# Patient Record
Sex: Female | Born: 1955 | Race: Black or African American | Hispanic: No | Marital: Married | Smoking: Current every day smoker
Health system: Southern US, Community
[De-identification: ages and names within clinical notes are randomized; demographics above are authoritative.]

## PROBLEM LIST (undated history)

## (undated) DIAGNOSIS — E119 Type 2 diabetes mellitus without complications: Secondary | ICD-10-CM

## (undated) DIAGNOSIS — F259 Schizoaffective disorder, unspecified: Secondary | ICD-10-CM

## (undated) DIAGNOSIS — I10 Essential (primary) hypertension: Secondary | ICD-10-CM

## (undated) DIAGNOSIS — F250 Schizoaffective disorder, bipolar type: Secondary | ICD-10-CM

## (undated) HISTORY — PX: APPENDECTOMY: SHX54

## (undated) HISTORY — PX: OOPHORECTOMY: SHX86

## (undated) HISTORY — PX: HERNIA REPAIR: SHX51

---

## 2007-08-05 ENCOUNTER — Emergency Department (HOSPITAL_COMMUNITY): Admission: EM | Admit: 2007-08-05 | Discharge: 2007-08-05 | Payer: Self-pay | Admitting: Emergency Medicine

## 2007-11-15 ENCOUNTER — Emergency Department (HOSPITAL_COMMUNITY): Admission: EM | Admit: 2007-11-15 | Discharge: 2007-11-15 | Payer: Self-pay | Admitting: Emergency Medicine

## 2009-02-14 ENCOUNTER — Emergency Department (HOSPITAL_COMMUNITY): Admission: EM | Admit: 2009-02-14 | Discharge: 2009-02-14 | Payer: Self-pay | Admitting: Emergency Medicine

## 2010-01-13 ENCOUNTER — Emergency Department (HOSPITAL_COMMUNITY): Admission: EM | Admit: 2010-01-13 | Discharge: 2010-01-13 | Payer: Self-pay | Admitting: Emergency Medicine

## 2010-04-30 ENCOUNTER — Emergency Department (HOSPITAL_COMMUNITY): Payer: Medicaid Other

## 2010-04-30 ENCOUNTER — Inpatient Hospital Stay (HOSPITAL_COMMUNITY)
Admission: EM | Admit: 2010-04-30 | Discharge: 2010-05-03 | DRG: 419 | Disposition: A | Discharge status: Home / Self Care | Payer: Medicaid Other | Attending: General Surgery | Admitting: General Surgery

## 2010-04-30 DIAGNOSIS — F17200 Nicotine dependence, unspecified, uncomplicated: Secondary | ICD-10-CM | POA: Diagnosis present

## 2010-04-30 DIAGNOSIS — F209 Schizophrenia, unspecified: Secondary | ICD-10-CM | POA: Diagnosis present

## 2010-04-30 DIAGNOSIS — E669 Obesity, unspecified: Secondary | ICD-10-CM | POA: Diagnosis present

## 2010-04-30 DIAGNOSIS — F1410 Cocaine abuse, uncomplicated: Secondary | ICD-10-CM | POA: Diagnosis present

## 2010-04-30 DIAGNOSIS — K8010 Calculus of gallbladder with chronic cholecystitis without obstruction: Principal | ICD-10-CM | POA: Diagnosis present

## 2010-04-30 DIAGNOSIS — E876 Hypokalemia: Secondary | ICD-10-CM | POA: Diagnosis not present

## 2010-04-30 DIAGNOSIS — I10 Essential (primary) hypertension: Secondary | ICD-10-CM | POA: Diagnosis present

## 2010-04-30 LAB — DIFFERENTIAL
Basophils Absolute: 0 10*3/uL (ref 0.0–0.1)
Basophils Relative: 0 % (ref 0–1)
Eosinophils Relative: 2 % (ref 0–5)
Lymphocytes Relative: 27 % (ref 12–46)
Lymphs Abs: 1.6 10*3/uL (ref 0.7–4.0)
Monocytes Absolute: 0.6 10*3/uL (ref 0.1–1.0)
Monocytes Relative: 9 % (ref 3–12)
Neutro Abs: 3.9 10*3/uL (ref 1.7–7.7)
Neutrophils Relative %: 62 % (ref 43–77)

## 2010-04-30 LAB — COMPREHENSIVE METABOLIC PANEL
ALT: 50 U/L — ABNORMAL HIGH (ref 0–35)
AST: 67 U/L — ABNORMAL HIGH (ref 0–37)
Alkaline Phosphatase: 132 U/L — ABNORMAL HIGH (ref 39–117)
BUN: 9 mg/dL (ref 6–23)
CO2: 27 mEq/L (ref 19–32)
Calcium: 8.9 mg/dL (ref 8.4–10.5)
Chloride: 100 mEq/L (ref 96–112)
Creatinine, Ser: 0.7 mg/dL (ref 0.4–1.2)
GFR calc Af Amer: >60 mL/min (ref >60)
GFR calc non Af Amer: >60 mL/min (ref >60)
Glucose, Bld: 121 mg/dL — ABNORMAL HIGH (ref 70–99)
Potassium: 3.2 mEq/L — ABNORMAL LOW (ref 3.5–5.1)
Sodium: 136 mEq/L (ref 135–145)
Total Bilirubin: 1.4 mg/dL — ABNORMAL HIGH (ref 0.3–1.2)
Total Protein: 7.3 g/dL (ref 6.0–8.3)

## 2010-04-30 LAB — CBC
HCT: 40.5 % (ref 36.0–46.0)
Hemoglobin: 13.5 g/dL (ref 12.0–15.0)
MCH: 28.4 pg (ref 26.0–34.0)
MCHC: 33.3 g/dL (ref 30.0–36.0)
MCV: 85.1 fL (ref 78.0–100.0)
RDW: 13.7 % (ref 11.5–15.5)
WBC: 6.2 10*3/uL (ref 4.0–10.5)

## 2010-04-30 LAB — URINALYSIS, ROUTINE W REFLEX MICROSCOPIC
Bilirubin Urine: NEGATIVE
Hgb urine dipstick: NEGATIVE
Ketones, ur: NEGATIVE mg/dL
Protein, ur: NEGATIVE mg/dL
Specific Gravity, Urine: 1.025 (ref 1.005–1.030)
Urine Glucose, Fasting: NEGATIVE mg/dL
Urobilinogen, UA: 1 mg/dL (ref 0.0–1.0)
pH: 6.5 (ref 5.0–8.0)

## 2010-04-30 LAB — LIPASE, BLOOD: Lipase: 33 U/L (ref 11–59)

## 2010-04-30 LAB — RAPID URINE DRUG SCREEN, HOSP PERFORMED
Amphetamines: NOT DETECTED
Barbiturates: NOT DETECTED
Benzodiazepines: NOT DETECTED
Cocaine: POSITIVE — AB
Opiates: POSITIVE — AB
Tetrahydrocannabinol: NOT DETECTED

## 2010-05-02 ENCOUNTER — Other Ambulatory Visit: Payer: Self-pay | Admitting: General Surgery

## 2010-05-02 ENCOUNTER — Inpatient Hospital Stay (HOSPITAL_COMMUNITY): Payer: Medicaid Other

## 2010-05-02 LAB — BASIC METABOLIC PANEL
BUN: 6 mg/dL (ref 6–23)
CO2: 29 mEq/L (ref 19–32)
GFR calc non Af Amer: >60 mL/min (ref >60)
Glucose, Bld: 122 mg/dL — ABNORMAL HIGH (ref 70–99)
Potassium: 3.5 mEq/L (ref 3.5–5.1)
Sodium: 140 mEq/L (ref 135–145)

## 2010-05-02 LAB — HEPATIC FUNCTION PANEL
ALT: 56 U/L — ABNORMAL HIGH (ref 0–35)
AST: 72 U/L — ABNORMAL HIGH (ref 0–37)
Alkaline Phosphatase: 161 U/L — ABNORMAL HIGH (ref 39–117)
Bilirubin, Direct: 0.1 mg/dL (ref 0.0–0.3)
Total Bilirubin: 0.3 mg/dL (ref 0.3–1.2)
Total Protein: 6.5 g/dL (ref 6.0–8.3)

## 2010-05-03 LAB — CBC
HCT: 36.4 % (ref 36.0–46.0)
Hemoglobin: 11.8 g/dL — ABNORMAL LOW (ref 12.0–15.0)
MCH: 27.4 pg (ref 26.0–34.0)
MCHC: 32.4 g/dL (ref 30.0–36.0)
MCV: 84.5 fL (ref 78.0–100.0)
Platelets: 306 10*3/uL (ref 150–400)
RBC: 4.31 MIL/uL (ref 3.87–5.11)
WBC: 5.2 10*3/uL (ref 4.0–10.5)

## 2010-05-03 LAB — COMPREHENSIVE METABOLIC PANEL
ALT: 66 U/L — ABNORMAL HIGH (ref 0–35)
AST: 118 U/L — ABNORMAL HIGH (ref 0–37)
Albumin: 3.1 g/dL — ABNORMAL LOW (ref 3.5–5.2)
BUN: 2 mg/dL — ABNORMAL LOW (ref 6–23)
CO2: 28 mEq/L (ref 19–32)
Calcium: 9 mg/dL (ref 8.4–10.5)
Chloride: 104 mEq/L (ref 96–112)
Creatinine, Ser: 0.82 mg/dL (ref 0.4–1.2)
GFR calc Af Amer: >60 mL/min (ref >60)
GFR calc non Af Amer: >60 mL/min (ref >60)
Glucose, Bld: 117 mg/dL — ABNORMAL HIGH (ref 70–99)
Potassium: 3.7 mEq/L (ref 3.5–5.1)
Sodium: 139 mEq/L (ref 135–145)
Total Bilirubin: 0.8 mg/dL (ref 0.3–1.2)

## 2010-05-08 NOTE — H&P (Signed)
  [name], [name]                ACCOUNT NO.:  [id number]  MEDICAL RECORD NO.:  [id number]           PATIENT TYPE:  I  LOCATION:  5151                         FACILITY:  [facility]  PHYSICIAN:  Ollen Gross. Vernell Morgans, M.D. DATE OF BIRTH:  1955/09/28  DATE OF ADMISSION:  04/30/2010 DATE OF DISCHARGE:                             HISTORY & PHYSICAL   This is a 55 year old black female who presents to emergency department with right upper quadrant pain that started this morning.  She describes the pain as severe.  The pain is not associated with nausea or vomiting. She denies any fevers or chills.  No chest pain or shortness of breath. No diarrhea or dysuria.  The rest of review of systems is unremarkable.  PAST MEDICAL HISTORY:  Significant for: 1. Schizophrenia. 2. Hypertension. 3. Shingles.  PAST SURGICAL HISTORY:  Significant for: 1. Open appendectomy. 2. Ventral hernia repair. 3. Oophorectomy.  MEDICATIONS:  Abilify, hydrochlorothiazide, Risperdal, Cogentin.  ALLERGIES:  No known drug allergies.  SOCIAL HISTORY:  She smokes cigarettes and has done cocaine recently.  FAMILY HISTORY:  Noncontributory.  PHYSICAL EXAMINATION:  VITAL SIGNS:  Temperature is 98.5, pulse 66, blood pressure 137/94. GENERAL:  She is a well-developed, well-nourished black female, in no acute distress. SKIN:  Warm and dry.  No jaundice. HEENT:  Eyes:  Her extraocular movements are intact.  Pupils equal round and reactive to light.  Sclerae nonicteric. LUNGS:  Clear bilaterally with no use of accessory respiratory muscles. HEART:  Regular rate and rhythm with impulse in left chest. ABDOMEN:  Soft with some mild right upper quadrant tenderness but no palpable mass or hepatosplenomegaly. EXTREMITIES:  No cyanosis, clubbing, or edema with good strength in arms and legs. PSYCHOLOGIC:  She is alert and oriented x3 with no significant anxiety or depression.  On review of her lab work is significant for a  white count that was normal.  Total bili was just 1.4.  She had an ultrasound that showed a 2- cm gallstone in the neck of gallbladder but no gallbladder wall thickness or dilation of the common duct.  ASSESSMENT AND PLAN:  This is a 55 year old black female with what sounds like symptomatic gallstones.  We will plan to admit her.  We will obtain a urine drug screen.  If she is positive for this, then she may need cholecystectomy during this admission.     Ollen Gross. Vernell Morgans, M.D.     PST/MEDQ  D:  04/30/2010  T:  05/01/2010  Job:  [id number]  Electronically Signed by Chevis Pretty III M.D. on 05/06/2010 06:57:27 AM

## 2010-05-10 NOTE — Discharge Summary (Signed)
  NAMEGIULLIANA, [name]                ACCOUNT NO.:  [id number]  MEDICAL RECORD NO.:  [id number]           PATIENT TYPE:  I  LOCATION:  5151                         FACILITY:  [facility]  PHYSICIAN:  Ollen Gross. Vernell Morgans, M.D. DATE OF BIRTH:  Nov 12, 1955  DATE OF ADMISSION:  04/30/2010 DATE OF DISCHARGE:  05/03/2010                              DISCHARGE SUMMARY   ADMISSION DIAGNOSES: 1. Symptomatic cholecystitis. 2. Cocaine ingestion. 3. Schizophrenia. 4. Hypertension. 5. Shingles.  DISCHARGE DIAGNOSES: 1. Symptomatic cholecystitis. 2. Cocaine ingestion. 3. Schizophrenia. 4. Hypertension. 5. Shingles.  PROCEDURES:  Laparoscopic cholecystectomy.  BRIEF HISTORY:  The patient is a 55 year old African American female who presented to the ER with abdominal pain in the right upper quadrant. She was admitted after workup showed elevated LFTs and an ultrasound which showed a 2-cm gallstone in the neck of the gallbladder.  There were no block gallbladder wall thickness or dilatation of the common duct.  As a part of her workup, drug screen was obtained which showed she was positive for cocaine.  The patient also has a history of schizophrenia, hypertension, shingles.  She was placed on the floor on antibiotics and fluid.  She was admitted to the floor and  made n.p.o.   HOSPITAL COURSE: She was given time to come off her cocaine, and given clear liquids on May 01, 2010, because she could not understand why she was not being fed.  She was taken to the OR the following day, May 02, 2010, underwent a laparoscopic cholecystectomy.  She tolerated the procedure well, is back on the floor.  This morning, she is taking clear liquids without difficulty, wants to eat and wants to go home.  We will plan to advance her diet and mobilize her.  If she continues to do well, she will go home after lunch. D/C INSTRUCTIONS: DISCHARGE MEDICATIONS:  She will resume her Abilify 1 tablet daily, Cogentin  1 tablet b.i.d. 1 mg tablets, hydrochlorothiazide 25 mg daily, ibuprofen p.r.n. for pain, risperidone 1 tablet b.i.d.  New prescription will be Vicodin 5/325 one to two tablets q.4 h. p.r.n.  She returned to the [facility] for follow up on May 17, 2010, at 3:15.  She is to follow up with primary care as before.  She can clean her incisions with plain soap and water, shower, and remove Steri-Strips in about 1 week.  CONDITION ON DISCHARGE:  Improved.     Eber Hong, P.A.   ______________________________ Ollen Gross. Vernell Morgans, M.D.    WDJ/MEDQ  D:  05/03/2010  T:  05/03/2010  Job:  [id number]  Electronically Signed by Sherrie George P.A. on 05/09/2010 01:50:49 PM Electronically Signed by Chevis Pretty III M.D. on 05/10/2010 10:35:51 AM

## 2010-05-16 NOTE — Op Note (Signed)
NAMESHELDA, TRUBY                ACCOUNT NO.:  [id number]  MEDICAL RECORD NO.:  [id number]           PATIENT TYPE:  I  LOCATION:  5151                         FACILITY:  [facility]  PHYSICIAN:  Mary Sella. Andrey Campanile, MD     DATE OF BIRTH:  1955-04-03  DATE OF PROCEDURE:  05/02/2010 DATE OF DISCHARGE:                              OPERATIVE REPORT   PREOPERATIVE DIAGNOSIS:  Symptomatic cholelithiasis.  POSTOPERATIVE DIAGNOSIS:  Symptomatic cholelithiasis.  PROCEDURE:  Laparoscopic cholecystectomy with intraoperative cholangiogram.  SURGEON:  Mary Sella. Andrey Campanile, MD.  ASSISTANT SURGEON:  Well Marlyne Beards, [place].  ANESTHESIA:  General plus local, consisting of 0.25% Marcaine with epi.  FINDINGS:  A critical view was obtained.  There was a normal intraoperative cholangiogram.  There was prompt opacification of the common bile duct, hepatic duct, and left and right hepatic ducts.  There was prompt emptying of contrast into the duodenum.  There was no evidence of filling defect.  There was a little bit of bleeding from the gallbladder fossa.  Hemostasis was achieved with electrocautery.  I did place a piece of surgical snow into the gallbladder fossa.  SPECIMEN:  Gallbladder.  ESTIMATED BLOOD LOSS:  Minimal.  INDICATIONS FOR PROCEDURE:  The patient is a 55 year old African American female who has had epigastric pain and right upper quadrant pain.  The pain was so severe, prompting her to come to the emergency room.  She was found to have LFT elevation as well as multiple gallstones.  She was admitted for pain control.  Today, her bilirubin normalized; however, some of her transaminases were still elevated.  It did not appear that she had an obstructive picture.  I therefore recommended proceeding to the operating room for laparoscopic cholecystectomy with intraoperative cholangiogram, possible open.  We discussed the risks and benefits of surgery including bleeding, infection, injury to  surrounding structures, injury to the common bile duct requiring major reconstructive bile duct surgery, DVT occurrence, bile leak, prolonged diarrhea, need to convert to an open procedure, as well as enterotomies.  She elected to proceed with surgery.  DESCRIPTION OF PROCEDURE:  After obtaining informed consent, the patient was brought to the operating room, placed supine on the operating table. General endotracheal anesthesia was established.  Sequential compression devices had already been placed.  Her abdomen was prepped and draped in the usual standard surgical fashion.  She received antibiotics prior to skin incision.  A surgical time-out was performed.  Because she had a prior lower midline as well as periumbilical incision, I elected to gain entry into her abdomen in the left upper quadrant using the OptiVu technique.  A 1-cm incision was made two fingerbreadths below her left subcostal margin with a #11 blade.  Then, using the 0-degree 5-mm camera through a 5-mm trocar, I passed the trocar and the camera through all layers of the abdominal wall under direct visualization and carefully entered her abdominal cavity.  Pneumoperitoneum was smoothly established up to a patient pressure of 15 mmHg.  She had 1 or 2 adhesions of omentum to her lower midline.  She also had some bile and strings between her  right hepatic surface and the anterior abdominal surface.  I then placed three additional trocars, one in the subxiphoid and two in the right hypochondrium; all under direct visualization after local had been infiltrated.  I then placed the Hasson trocar by incising her skin right above the belly button, 1 inch in length; dividing the subcutaneous tissue.  The fascia was grasped and lifted anteriorly. Next, the fascia was incised with a #11 blade, and a pursestring suture was placed around the fascial edges using a 0 Vicryl on the UR6 needle. We then placed her in reverse Trendelenburg  and rotated her slightly to the left.  The gallbladder was grasped and went to the right shoulder. The infundibulum was grasped, retracted laterally.  I then incised the peritoneum both medially and laterally using hook electrocautery.  This helped me with mobilization.  I identified the cystic duct as well as the cystic artery, each were circumferentially dissected around with the aid of a Jola Babinski.  The critical view was obtained.  There were no other structures entering the gallbladder.  One clip was placed on the distal cystic duct as it entered the gallbladder, then it was partially transected with Endo shears.  The cholangiogram catheter was advanced and secured into the cystic duct with a clip.  The cholangiogram was performed as described above.  The clip securing the cholangiogram catheter was removed, and three clips were placed on the downside of the cystic duct.  Two clips were placed on the downside of cystic artery and one distal.  It was then transected with Endo shears along with the cystic duct.  There was a small posterior branch of the cystic artery, two clips were placed on the downside of that.  We then rolled the gallbladder up at the gallbladder fossa using traction and counter traction.  Hook electrocautery was used to free it.  There was a little bit of bleeding from the gallbladder fossa.  The gallbladder was essentially free.  I then placed the laparoscope in the subxiphoid trocar and advanced the Endobag up through the umbilical trocar.  The gallbladder was placed in the bag.  The gallbladder was quite distended and large.  I ended up incising the fascia superior slightly, about half a centimeter with a #11 blade, thus the specimen bag containing the gallbladder was easily removed.  We reestablished pneumoperitoneum.  I reinspected the gallbladder fossa.  There was still a little bit of oozing.  Electrocautery was used to obtain hemostasis.  I irrigated  the right upper quadrant with 1.5 L of saline.  There was no evidence of ongoing bleeding.  There was no evidence of bile leak.  I did, however, elect to place a piece of surgical snow in the gallbladder fossa.  I then removed the Hasson trocar, I tied down the previously placed pursestring suture.  Even though there was no air leak, there was still felt to be a little bit of a gap.  I placed two additional interrupted 0 Vicryl sutures to reapproximate the fascia even tighter.  The fascial closure was reinspected laparoscopically.  There was no evidence of any air leak, and there was nothing within our closure.  I reinspected the right upper quadrant and removed any remaining irrigation fluid.  There was no evidence of any ongoing bleeding.  Pneumoperitoneum was released, and all four remaining trocar incisions were removed.  All skin incisions were closed with a 4-0 Monocryl in a subcuticular fashion followed by application of Benzoin, Steri-Strips and sterile  bandages. There were no immediate complications.  The patient tolerated the procedure well.  She was extubated and taken to recovery room in stable addition.  All needle and instrument counts were correct x2.     Mary Sella. Andrey Campanile, MD     EMW/MEDQ  D:  05/02/2010  T:  05/03/2010  Job:  [id number]  Electronically Signed by Gaynelle Adu M.D. on 05/16/2010 08:28:33 AM

## 2010-09-09 ENCOUNTER — Emergency Department (HOSPITAL_COMMUNITY)
Admission: EM | Admit: 2010-09-09 | Discharge: 2010-09-09 | Disposition: A | Discharge status: Home / Self Care | Payer: Medicaid Other | Attending: Emergency Medicine | Admitting: Emergency Medicine

## 2010-09-09 ENCOUNTER — Emergency Department (HOSPITAL_COMMUNITY): Payer: Medicaid Other

## 2010-09-09 DIAGNOSIS — R5381 Other malaise: Secondary | ICD-10-CM | POA: Insufficient documentation

## 2010-09-09 DIAGNOSIS — J3489 Other specified disorders of nose and nasal sinuses: Secondary | ICD-10-CM | POA: Insufficient documentation

## 2010-09-09 DIAGNOSIS — I10 Essential (primary) hypertension: Secondary | ICD-10-CM | POA: Insufficient documentation

## 2010-09-09 DIAGNOSIS — R059 Cough, unspecified: Secondary | ICD-10-CM | POA: Insufficient documentation

## 2010-09-09 DIAGNOSIS — R6883 Chills (without fever): Secondary | ICD-10-CM | POA: Insufficient documentation

## 2010-09-09 DIAGNOSIS — J40 Bronchitis, not specified as acute or chronic: Secondary | ICD-10-CM | POA: Insufficient documentation

## 2010-09-09 DIAGNOSIS — R51 Headache: Secondary | ICD-10-CM | POA: Insufficient documentation

## 2010-09-09 DIAGNOSIS — J029 Acute pharyngitis, unspecified: Secondary | ICD-10-CM | POA: Insufficient documentation

## 2010-09-09 DIAGNOSIS — J069 Acute upper respiratory infection, unspecified: Secondary | ICD-10-CM | POA: Insufficient documentation

## 2010-09-09 DIAGNOSIS — R05 Cough: Secondary | ICD-10-CM | POA: Insufficient documentation

## 2011-01-21 ENCOUNTER — Emergency Department (HOSPITAL_COMMUNITY): Payer: Medicaid Other

## 2011-01-21 ENCOUNTER — Encounter (HOSPITAL_COMMUNITY): Payer: Self-pay | Admitting: Radiology

## 2011-01-21 ENCOUNTER — Emergency Department (HOSPITAL_COMMUNITY)
Admission: EM | Admit: 2011-01-21 | Discharge: 2011-01-21 | Disposition: A | Discharge status: Home / Self Care | Payer: Medicaid Other | Attending: Emergency Medicine | Admitting: Emergency Medicine

## 2011-01-21 DIAGNOSIS — R42 Dizziness and giddiness: Secondary | ICD-10-CM | POA: Insufficient documentation

## 2011-01-21 DIAGNOSIS — E876 Hypokalemia: Secondary | ICD-10-CM | POA: Insufficient documentation

## 2011-01-21 DIAGNOSIS — I10 Essential (primary) hypertension: Secondary | ICD-10-CM | POA: Insufficient documentation

## 2011-01-21 HISTORY — DX: Essential (primary) hypertension: I10

## 2011-01-21 LAB — POCT I-STAT, CHEM 8
BUN: 5 mg/dL — ABNORMAL LOW (ref 6–23)
Calcium, Ion: 1.16 mmol/L (ref 1.12–1.32)
Chloride: 99 mEq/L (ref 96–112)
Creatinine, Ser: 0.9 mg/dL (ref 0.50–1.10)
Glucose, Bld: 117 mg/dL — ABNORMAL HIGH (ref 70–99)
HCT: 43 % (ref 36.0–46.0)
Hemoglobin: 14.6 g/dL (ref 12.0–15.0)
Potassium: 2.9 mEq/L — ABNORMAL LOW (ref 3.5–5.1)
Sodium: 141 mEq/L (ref 135–145)
TCO2: 29 mmol/L (ref 0–100)

## 2011-02-23 ENCOUNTER — Encounter (HOSPITAL_COMMUNITY): Payer: Self-pay | Admitting: Emergency Medicine

## 2011-02-23 ENCOUNTER — Emergency Department (HOSPITAL_COMMUNITY)
Admission: EM | Admit: 2011-02-23 | Discharge: 2011-02-23 | Disposition: A | Discharge status: Home / Self Care | Payer: Medicaid Other | Attending: Emergency Medicine | Admitting: Emergency Medicine

## 2011-02-23 ENCOUNTER — Emergency Department (HOSPITAL_COMMUNITY): Payer: Medicaid Other

## 2011-02-23 DIAGNOSIS — I10 Essential (primary) hypertension: Secondary | ICD-10-CM | POA: Insufficient documentation

## 2011-02-23 DIAGNOSIS — R079 Chest pain, unspecified: Secondary | ICD-10-CM | POA: Insufficient documentation

## 2011-02-23 DIAGNOSIS — IMO0001 Reserved for inherently not codable concepts without codable children: Secondary | ICD-10-CM | POA: Insufficient documentation

## 2011-02-23 HISTORY — DX: Schizoaffective disorder, unspecified: F25.9

## 2011-02-23 HISTORY — DX: Schizoaffective disorder, bipolar type: F25.0

## 2011-02-23 LAB — DIFFERENTIAL
Basophils Absolute: 0 10*3/uL (ref 0.0–0.1)
Eosinophils Absolute: 0 10*3/uL (ref 0.0–0.7)
Eosinophils Relative: 0 % (ref 0–5)
Lymphocytes Relative: 11 % — ABNORMAL LOW (ref 12–46)
Lymphs Abs: 0.9 10*3/uL (ref 0.7–4.0)
Monocytes Absolute: 1.5 10*3/uL — ABNORMAL HIGH (ref 0.1–1.0)
Monocytes Relative: 18 % — ABNORMAL HIGH (ref 3–12)
Neutro Abs: 5.9 10*3/uL (ref 1.7–7.7)

## 2011-02-23 LAB — URINALYSIS, ROUTINE W REFLEX MICROSCOPIC
Bilirubin Urine: NEGATIVE
Glucose, UA: NEGATIVE mg/dL
Hgb urine dipstick: NEGATIVE
Ketones, ur: NEGATIVE mg/dL
Protein, ur: NEGATIVE mg/dL
Specific Gravity, Urine: 1.024 (ref 1.005–1.030)
pH: 5.5 (ref 5.0–8.0)

## 2011-02-23 LAB — CBC
HCT: 37 % (ref 36.0–46.0)
Hemoglobin: 12.3 g/dL (ref 12.0–15.0)
MCHC: 33.2 g/dL (ref 30.0–36.0)
MCV: 86.2 fL (ref 78.0–100.0)
Platelets: 223 10*3/uL (ref 150–400)
RDW: 13.9 % (ref 11.5–15.5)
WBC: 8.5 10*3/uL (ref 4.0–10.5)

## 2011-02-23 LAB — POCT I-STAT, CHEM 8
BUN: 12 mg/dL (ref 6–23)
Calcium, Ion: 1.06 mmol/L — ABNORMAL LOW (ref 1.12–1.32)
Chloride: 100 mEq/L (ref 96–112)
Creatinine, Ser: 1 mg/dL (ref 0.50–1.10)
Glucose, Bld: 147 mg/dL — ABNORMAL HIGH (ref 70–99)
HCT: 41 % (ref 36.0–46.0)
Hemoglobin: 13.9 g/dL (ref 12.0–15.0)
Potassium: 3.1 mEq/L — ABNORMAL LOW (ref 3.5–5.1)
Sodium: 136 mEq/L (ref 135–145)
TCO2: 24 mmol/L (ref 0–100)

## 2011-02-23 MED ORDER — ACETAMINOPHEN 325 MG PO TABS
ORAL_TABLET | ORAL | Status: AC
  Administered 2011-02-23: 650 mg
  Filled 2011-02-23: qty 2

## 2011-02-23 MED ORDER — IBUPROFEN 800 MG PO TABS: 800.0000 mg | ORAL_TABLET | Freq: Three times a day (TID) | ORAL | Status: DC

## 2011-02-23 MED ORDER — OSELTAMIVIR PHOSPHATE 75 MG PO CAPS: 75.0000 mg | ORAL_CAPSULE | Freq: Two times a day (BID) | ORAL | Status: DC

## 2011-02-23 MED ORDER — SODIUM CHLORIDE 0.9 % IV BOLUS (SEPSIS)
1000.0000 mL | Freq: Once | INTRAVENOUS | Status: AC
  Administered 2011-02-23: 1000 mL via INTRAVENOUS

## 2011-02-23 MED ORDER — KETOROLAC TROMETHAMINE 30 MG/ML IJ SOLN
30.0000 mg | Freq: Once | INTRAMUSCULAR | Status: AC
  Administered 2011-02-23: 30 mg via INTRAVENOUS
  Filled 2011-02-23: qty 1

## 2011-02-23 NOTE — ED Notes (Signed)
ZOX:[id number]<AV> Expected date:02/23/11<BR> Expected time: 9:31 AM<BR> Means of arrival:Ambulance<BR> Comments:<BR> Flu like s/s

## 2011-02-23 NOTE — ED Provider Notes (Signed)
Medical screening examination/treatment/procedure(s) were performed by non-physician practitioner and as supervising physician I was immediately available for consultation/collaboration.  Flint Melter, MD 02/23/11 2033

## 2011-02-23 NOTE — ED Provider Notes (Signed)
History     CSN: [id number] Arrival date & time: 02/23/2011 10:00 AM   First MD Initiated Contact with Patient 02/23/11 1059     11:39 AM HPI Patient reports flulike symptoms that began yesterday. States more symptoms include myalgias and chills. Reports family members with similar symptoms. Reports taking her flu shot and feels like she has the flu. Denies shortness of breath, neck pain, abdominal pain. Has not tried any medications at home to relieve symptoms. Patient is a 55 y.o. female presenting with flu symptoms. The history is provided by the patient.  Influenza This is a new problem. The current episode started yesterday. The problem occurs constantly. The problem has been gradually worsening. Associated symptoms include chest pain, chills, congestion, coughing, fatigue, a fever, headaches, myalgias and neck pain. Pertinent negatives include no abdominal pain, nausea, rash, sore throat, swollen glands, urinary symptoms, vomiting or weakness.    Past Medical History  Diagnosis Date  . Hypertension   . Schizo-affective schizophrenia     No past surgical history on file.  No family history on file.  History  Substance Use Topics  . Smoking status: Former Games developer  . Smokeless tobacco: Not on file  . Alcohol Use: No    OB History    Grav Para Term Preterm Abortions TAB SAB Ect Mult Living                  Review of Systems  Constitutional: Positive for fever, chills and fatigue.  HENT: Positive for congestion and neck pain. Negative for sore throat.   Respiratory: Positive for cough.   Cardiovascular: Positive for chest pain.  Gastrointestinal: Negative for nausea, vomiting and abdominal pain.  Musculoskeletal: Positive for myalgias.  Skin: Negative for rash.  Neurological: Positive for headaches. Negative for weakness.  All other systems reviewed and are negative.    Allergies  Review of patient's allergies indicates no known allergies.  Home Medications  No  current outpatient prescriptions on file.  BP 103/58  Pulse 93  Temp(Src) 103.1 F (39.5 C) (Oral)  Resp 20  Ht 5\' 4"  (1.626 m)  Wt 240 lb (108.863 kg)  BMI 41.20 kg/m2  SpO2 95%  Physical Exam  Vitals reviewed. Constitutional: She is oriented to person, place, and time. Vital signs are normal. She appears well-developed and well-nourished.  HENT:  Head: Normocephalic and atraumatic.  Right Ear: Tympanic membrane, external ear and ear canal normal.  Left Ear: Tympanic membrane, external ear and ear canal normal.  Nose: Nose normal.  Mouth/Throat: Uvula is midline, oropharynx is clear and moist and mucous membranes are normal.  Eyes: Conjunctivae are normal. Pupils are equal, round, and reactive to light.  Neck: Trachea normal and normal range of motion. Neck supple. No spinous process tenderness and no muscular tenderness present. No rigidity. No mass and no thyromegaly present.  Cardiovascular: Normal rate, regular rhythm and normal heart sounds.  Exam reveals no friction rub.   No murmur heard. Pulmonary/Chest: Effort normal and breath sounds normal. She has no wheezes. She has no rhonchi. She has no rales. She exhibits no tenderness.  Musculoskeletal: Normal range of motion.  Neurological: She is alert and oriented to person, place, and time. Coordination normal.  Skin: Skin is warm and dry. No rash noted. No erythema. No pallor.    ED Course  Procedures   Will Treat with tamiflu and ibuprofen and acetaminophen.  MDM         Thomasene Lot, PA 02/23/11 1235

## 2011-02-23 NOTE — ED Notes (Addendum)
Pt c/o body aches and pain with fever x2 days

## 2011-03-03 ENCOUNTER — Encounter (HOSPITAL_COMMUNITY): Payer: Self-pay | Admitting: Physical Medicine and Rehabilitation

## 2011-03-03 ENCOUNTER — Emergency Department (HOSPITAL_COMMUNITY)
Admission: EM | Admit: 2011-03-03 | Discharge: 2011-03-03 | Disposition: A | Discharge status: Home / Self Care | Payer: Medicaid Other | Attending: Emergency Medicine | Admitting: Emergency Medicine

## 2011-03-03 DIAGNOSIS — I10 Essential (primary) hypertension: Secondary | ICD-10-CM | POA: Insufficient documentation

## 2011-03-03 DIAGNOSIS — F259 Schizoaffective disorder, unspecified: Secondary | ICD-10-CM | POA: Insufficient documentation

## 2011-03-03 DIAGNOSIS — H6120 Impacted cerumen, unspecified ear: Secondary | ICD-10-CM | POA: Insufficient documentation

## 2011-03-03 DIAGNOSIS — H8110 Benign paroxysmal vertigo, unspecified ear: Secondary | ICD-10-CM

## 2011-03-03 MED ORDER — LORAZEPAM 1 MG PO TABS: 1.0000 mg | ORAL_TABLET | Freq: Three times a day (TID) | ORAL | Status: AC | PRN

## 2011-03-03 MED ORDER — LORAZEPAM 1 MG PO TABS
1.0000 mg | ORAL_TABLET | Freq: Once | ORAL | Status: AC
  Administered 2011-03-03: 1 mg via ORAL
  Filled 2011-03-03: qty 1

## 2011-03-03 NOTE — ED Provider Notes (Signed)
History     CSN: [id number] Arrival date & time: 03/03/2011  5:42 PM   First MD Initiated Contact with Patient 03/03/11 2044      Chief Complaint  Patient presents with  . Dizziness    HPI  History provided by the patient. Patient presents with complaints of dizziness. Symptoms are described as "my head spinning". Symptoms last only brief seconds. They're made worse by certain movements and positions especially with lying back and having had turned to the right. Gums are better patient states that she turns her head to the left and remains still. Patient does report having some symptoms of a URI last month. Currently she denies any fever, chills, sweats, headache, nausea, vomiting, cough, nasal congestion. Patient does report having an appointment with her PCP next Wednesday for these complaints. She reports that she had similar episodes once before that only lasted a day or so and went away. she denies any confusion, weakness in extremities, numbness or tingling, difficulty speaking or vision changes.   Past Medical History  Diagnosis Date  . Hypertension   . Schizo-affective schizophrenia     History reviewed. No pertinent past surgical history.  History reviewed. No pertinent family history.  History  Substance Use Topics  . Smoking status: Former Games developer  . Smokeless tobacco: Not on file  . Alcohol Use: No    OB History    Grav Para Term Preterm Abortions TAB SAB Ect Mult Living                  Review of Systems  Constitutional: Negative for fever and chills.  HENT: Negative for ear pain, congestion, sore throat and rhinorrhea.   Respiratory: Negative for cough.   Cardiovascular: Negative for chest pain.  Gastrointestinal: Negative for nausea, vomiting, diarrhea and constipation.  Neurological: Positive for dizziness.  All other systems reviewed and are negative.    Allergies  Review of patient's allergies indicates no known allergies.  Home Medications  No  current outpatient prescriptions on file.  BP 101/67  Pulse 66  Temp(Src) 98.3 F (36.8 C) (Oral)  Resp 16  SpO2 100%  Physical Exam  Nursing note and vitals reviewed. Constitutional: She appears well-developed. No distress.  HENT:  Head: Normocephalic and atraumatic.  Right Ear: External ear normal.  Mouth/Throat: Oropharynx is clear and moist.       Left cerumen impaction  Eyes: Conjunctivae and EOM are normal. Pupils are equal, round, and reactive to light.  Neck: Normal range of motion. Neck supple.  Cardiovascular: Normal rate, regular rhythm and normal heart sounds.   Pulmonary/Chest: Effort normal and breath sounds normal.  Musculoskeletal: She exhibits no edema and no tenderness.  Lymphadenopathy:    She has no cervical adenopathy.  Neurological: She is alert. She has normal strength. No cranial nerve deficit or sensory deficit. Coordination and gait normal.       Vision with reproducible symptoms during Dix-Hallpike maneuver with head turned to the right. No notable nystagmus. She has nonfocal neuro exam  Skin: Skin is warm. No rash noted.  Psychiatric: She has a normal mood and affect. Her behavior is normal.    ED Course  Procedures (including critical care time)   1. BPPV (benign paroxysmal positional vertigo)      MDM  9:00 PM patient seen and evaluated. Patient in no acute distress. Pt's symptoms consistent with peripheral vertigo. She has normal nonfocal neuro exam.   Patient having improvement of symptoms. Patient was discussed with attending  physician, he agrees with diagnosis and treatment plan.     Angus Seller, [place] 03/03/11 2133

## 2011-03-03 NOTE — ED Notes (Signed)
Pt states, "I have never felt like this before, I just feel like something is wrong. I don't want to fall asleep I am afraid I won't wake up. My head just is spinning so much. About 2 mths ago this happened & my Potassium was low & I came to [facility] ED & was given Potassium & now it has come back x 2days."

## 2011-03-03 NOTE — ED Notes (Signed)
Updated on delay. No distress noted.  

## 2011-03-03 NOTE — ED Notes (Signed)
Pt presents to department for evaluation of dizziness. Pt states she feels like she is going to pass out, becomes very dizzy with standing. Ongoing for several days. Pt states she had this before, but it went away without medical attention. Pt is conscious alert and oriented x4. No neurological deficits noted. Pupils equal and reactive.

## 2011-03-04 NOTE — ED Provider Notes (Signed)
Medical screening examination/treatment/procedure(s) were performed by non-physician practitioner and as supervising physician I was immediately available for consultation/collaboration.  Zian Mohamed P Glendia Olshefski, MD 03/04/11 1507 

## 2012-03-16 ENCOUNTER — Emergency Department (HOSPITAL_COMMUNITY)
Admission: EM | Admit: 2012-03-16 | Discharge: 2012-03-16 | Disposition: A | Discharge status: Home / Self Care | Payer: Medicaid Other | Attending: Emergency Medicine | Admitting: Emergency Medicine

## 2012-03-16 ENCOUNTER — Encounter (HOSPITAL_COMMUNITY): Payer: Self-pay | Admitting: *Deleted

## 2012-03-16 ENCOUNTER — Emergency Department (HOSPITAL_COMMUNITY): Payer: Medicaid Other

## 2012-03-16 DIAGNOSIS — I10 Essential (primary) hypertension: Secondary | ICD-10-CM | POA: Insufficient documentation

## 2012-03-16 DIAGNOSIS — M25559 Pain in unspecified hip: Secondary | ICD-10-CM | POA: Insufficient documentation

## 2012-03-16 DIAGNOSIS — Z87891 Personal history of nicotine dependence: Secondary | ICD-10-CM | POA: Insufficient documentation

## 2012-03-16 DIAGNOSIS — M1990 Unspecified osteoarthritis, unspecified site: Secondary | ICD-10-CM

## 2012-03-16 DIAGNOSIS — Z79899 Other long term (current) drug therapy: Secondary | ICD-10-CM | POA: Insufficient documentation

## 2012-03-16 DIAGNOSIS — G8929 Other chronic pain: Secondary | ICD-10-CM | POA: Insufficient documentation

## 2012-03-16 DIAGNOSIS — F259 Schizoaffective disorder, unspecified: Secondary | ICD-10-CM | POA: Insufficient documentation

## 2012-03-16 DIAGNOSIS — M1610 Unilateral primary osteoarthritis, unspecified hip: Secondary | ICD-10-CM | POA: Insufficient documentation

## 2012-03-16 MED ORDER — NAPROXEN 250 MG PO TABS
500.0000 mg | ORAL_TABLET | Freq: Two times a day (BID) | ORAL | Status: DC
  Administered 2012-03-16: 500 mg via ORAL
  Filled 2012-03-16: qty 2

## 2012-03-16 MED ORDER — NAPROXEN 500 MG PO TABS: 500.0000 mg | ORAL_TABLET | Freq: Two times a day (BID) | ORAL | Status: DC

## 2012-03-16 NOTE — ED Notes (Signed)
Pt reports left hip pain x 2 days. Described as sharp pain radiating up towards abdomen and down to thigh. States "it gave out on me, but I didn't fall. I had to call someone to come help me." Pain 10/10 with movement and laying on the left side. 0/10 when still. Speech rapid and pressured. Reports hxt of schzo effective disorder.

## 2012-03-16 NOTE — ED Notes (Signed)
Pt states understanding of discharge instructions 

## 2012-03-16 NOTE — ED Provider Notes (Signed)
History     CSN: [id number]  Arrival date & time 03/16/12  1844   First MD Initiated Contact with Patient 03/16/12 1957      Chief Complaint  Patient presents with  . Hip Pain    (Consider location/radiation/quality/duration/timing/severity/associated sxs/prior treatment) HPI Comments: This is a 56 year old female, who presents emergency department with chief complaint of left sided hip pain. Patient states that the pain is mostly in her left buttock and radiates down the back of her leg. She states that this pain has chronic, though it seems to be worsened over the past couple of days. Patient has tried taking advil with significant relief. Patient wants an x-ray, because she is concerned about cooking Christmas dinner. She is able to ambulate appropriately. Patient states that her pain is moderate in severity.  The history is provided by the patient.    Past Medical History  Diagnosis Date  . Hypertension   . Schizo-affective schizophrenia     History reviewed. No pertinent past surgical history.  History reviewed. No pertinent family history.  History  Substance Use Topics  . Smoking status: Former Games developer  . Smokeless tobacco: Not on file  . Alcohol Use: No    OB History    Grav Para Term Preterm Abortions TAB SAB Ect Mult Living                  Review of Systems  All other systems reviewed and are negative.    Allergies  Review of patient's allergies indicates no known allergies.  Home Medications   Current Outpatient Rx  Name  Route  Sig  Dispense  Refill  . ARIPIPRAZOLE 30 MG PO TABS   Oral   Take 30 mg by mouth daily.           BP 130/95  Pulse 69  Temp 99.5 F (37.5 C) (Oral)  Resp 16  SpO2 97%  Physical Exam  Nursing note and vitals reviewed. Constitutional: She is oriented to person, place, and time. She appears well-developed and well-nourished.  HENT:  Head: Normocephalic and atraumatic.  Eyes: Conjunctivae normal and EOM are  normal. Pupils are equal, round, and reactive to light.  Neck: Normal range of motion. Neck supple.  Cardiovascular: Normal rate and regular rhythm.  Exam reveals no gallop and no friction rub.   No murmur heard. Pulmonary/Chest: Effort normal and breath sounds normal. No respiratory distress. She has no wheezes. She has no rales. She exhibits no tenderness.  Abdominal: Soft. Bowel sounds are normal. She exhibits no distension and no mass. There is no tenderness. There is no rebound and no guarding.  Musculoskeletal: Normal range of motion. She exhibits no edema and no tenderness.       Full range of motion in left hip, however the patient states that it hurts to move, strength is 5 out of 5. No signs of abscess, cellulitis, no warmth of the joints, no bruising or swelling.  Neurological: She is alert and oriented to person, place, and time.  Skin: Skin is warm and dry.  Psychiatric: She has a normal mood and affect. Her behavior is normal. Judgment and thought content normal.    ED Course  Procedures (including critical care time)  Labs Reviewed - No data to display Dg Hip Complete Left  03/16/2012  *RADIOLOGY REPORT*  Clinical Data: Fall.  Left hip pain.  LEFT HIP - COMPLETE 2+ VIEW  Comparison: None.  Findings: The pelvic bony ring is intact.  Left  hip is located without acute fracture.  The sacroiliac joints are symmetric.  IMPRESSION: No acute bony abnormality to the pelvis or left hip.   Original Report Authenticated By: Richarda Overlie, M.D.      1. Hip pain   2. Arthritis       MDM  56 year old female with left-sided hip pain. I believe this to be arthritis. The patient is able to ambulate, and asks if she can go home after receiving the x-ray. Plain films show no acute process. I find no evidence of swelling, or abscess, or bony tenderness on my exam. I will treat the patient with Naprosyn, and have her followup with primary care provider or orthopedics. Patient understands and  agrees with the plan. Return precautions have been given. She is stable and ready for discharge.        Roxy Horseman, PA-C 03/17/12 [phone]

## 2012-03-16 NOTE — ED Notes (Signed)
Lt. Hip, let. Leg pain

## 2012-03-17 NOTE — ED Provider Notes (Signed)
Medical screening examination/treatment/procedure(s) were performed by non-physician practitioner and as supervising physician I was immediately available for consultation/collaboration.   Carleene Cooper III, MD 03/17/12 [phone]

## 2012-03-20 ENCOUNTER — Emergency Department (HOSPITAL_COMMUNITY)
Admission: EM | Admit: 2012-03-20 | Discharge: 2012-03-20 | Disposition: A | Discharge status: Home / Self Care | Payer: Medicaid Other | Attending: Emergency Medicine | Admitting: Emergency Medicine

## 2012-03-20 ENCOUNTER — Encounter (HOSPITAL_COMMUNITY): Payer: Self-pay | Admitting: Emergency Medicine

## 2012-03-20 DIAGNOSIS — Z791 Long term (current) use of non-steroidal anti-inflammatories (NSAID): Secondary | ICD-10-CM | POA: Insufficient documentation

## 2012-03-20 DIAGNOSIS — M25559 Pain in unspecified hip: Secondary | ICD-10-CM

## 2012-03-20 DIAGNOSIS — F17200 Nicotine dependence, unspecified, uncomplicated: Secondary | ICD-10-CM | POA: Insufficient documentation

## 2012-03-20 DIAGNOSIS — F259 Schizoaffective disorder, unspecified: Secondary | ICD-10-CM | POA: Insufficient documentation

## 2012-03-20 DIAGNOSIS — Z79899 Other long term (current) drug therapy: Secondary | ICD-10-CM | POA: Insufficient documentation

## 2012-03-20 DIAGNOSIS — I10 Essential (primary) hypertension: Secondary | ICD-10-CM | POA: Insufficient documentation

## 2012-03-20 MED ORDER — KETOROLAC TROMETHAMINE 60 MG/2ML IM SOLN
60.0000 mg | Freq: Once | INTRAMUSCULAR | Status: AC
  Administered 2012-03-20: 60 mg via INTRAMUSCULAR
  Filled 2012-03-20: qty 2

## 2012-03-20 MED ORDER — CYCLOBENZAPRINE HCL 10 MG PO TABS: 10.0000 mg | ORAL_TABLET | Freq: Two times a day (BID) | ORAL | Status: DC | PRN

## 2012-03-20 NOTE — ED Provider Notes (Signed)
Medical screening examination/treatment/procedure(s) were performed by non-physician practitioner and as supervising physician I was immediately available for consultation/collaboration.  Raeford Razor, MD 03/20/12 [phone]

## 2012-03-20 NOTE — ED Provider Notes (Signed)
History     CSN: [id number]  Arrival date & time 03/20/12  1145   First MD Initiated Contact with Patient 03/20/12 1156      Chief Complaint  Patient presents with  . Hip Pain    l/hip pain, recurrent    (Consider location/radiation/quality/duration/timing/severity/associated sxs/prior treatment) HPI Comments: 56 year old female with a history of schizophrenia presents to the emergency department with her son complaining of continuing left hip pain after being seen on December 22. She was discharged at that time with Naprosyn and states this provides no relief. Describes the pain as throbbing rated 10 out of 10, worse with walking. Denies numbness or tingling down her leg, states "it is just my hip, leave me alone, I am in pain". No back pain. No known injury or trauma. She had plain films at her last visit which had evidence of arthritis and no other abnormality.  Patient is a 56 y.o. female presenting with hip pain. The history is provided by the patient and a relative.  Hip Pain Associated symptoms include arthralgias. Pertinent negatives include no numbness.    Past Medical History  Diagnosis Date  . Hypertension   . Schizo-affective schizophrenia     Past Surgical History  Procedure Date  . Appendectomy   . Hernia repair   . Oophorectomy unilateral    Family History  Problem Relation Age of Onset  . Hypertension Mother   . Diabetes Mother   . Heart failure Mother     History  Substance Use Topics  . Smoking status: Current Every Day Smoker    Types: Cigarettes  . Smokeless tobacco: Not on file  . Alcohol Use: No    OB History    Grav Para Term Preterm Abortions TAB SAB Ect Mult Living                  Review of Systems  Constitutional: Negative.   Musculoskeletal: Positive for arthralgias and gait problem. Negative for back pain.  Skin: Negative.   Neurological: Negative for numbness.    Allergies  Review of patient's allergies indicates no known  allergies.  Home Medications   Current Outpatient Rx  Name  Route  Sig  Dispense  Refill  . ARIPIPRAZOLE 30 MG PO TABS   Oral   Take 30 mg by mouth daily.         . IBUPROFEN 200 MG PO TABS   Oral   Take 800 mg by mouth every 6 (six) hours as needed. For pain         . NAPROXEN 500 MG PO TABS   Oral   Take 1 tablet (500 mg total) by mouth 2 (two) times daily with a meal.   30 tablet   0   . CYCLOBENZAPRINE HCL 10 MG PO TABS   Oral   Take 1 tablet (10 mg total) by mouth 2 (two) times daily as needed for muscle spasms.   10 tablet   0     BP 127/86  Pulse 70  Temp 98.4 F (36.9 C) (Oral)  Resp 18  Physical Exam  Nursing note and vitals reviewed. Constitutional: She is oriented to person, place, and time. She appears well-developed.       Obese  HENT:  Head: Normocephalic and atraumatic.  Eyes: Conjunctivae normal are normal.  Neck: Normal range of motion.  Cardiovascular: Normal rate, regular rhythm, normal heart sounds and intact distal pulses.   Pulmonary/Chest: Effort normal and breath sounds normal.  Musculoskeletal:       Left hip: She exhibits tenderness (lateral aspect). She exhibits normal range of motion, normal strength, no bony tenderness, no swelling and no crepitus.  Neurological: She is alert and oriented to person, place, and time. She has normal strength. No sensory deficit. Gait (limping) abnormal.  Skin: Skin is warm and dry.  Psychiatric: Her affect is blunt. She is agitated.    ED Course  Procedures (including critical care time)  Labs Reviewed - No data to display No results found.   1. Hip pain       MDM  56 y/o female with continuing hip pain after being seen on 12/22. Patient is uncooperative on PE stating it is "just her hip, leave her alone". Eventually son got her to cooperate. No edema noted around hip. She is able to ambulate. Full ROM. Good strength. Toradol given. Discharged with flexeril. She has an appointment with  ortho next week as advised at her last visit. Return precautions discussed.        Trevor Mace, PA-C 03/20/12 1245

## 2012-03-20 NOTE — ED Notes (Signed)
Pain l/hip. Difficulty walking due to pain. Pt reports that there was no relief of pain with naproxen, pt seen 12/21

## 2012-06-05 ENCOUNTER — Emergency Department (HOSPITAL_COMMUNITY)
Admission: EM | Admit: 2012-06-05 | Discharge: 2012-06-05 | Disposition: A | Discharge status: Home / Self Care | Payer: Medicaid Other | Attending: Emergency Medicine | Admitting: Emergency Medicine

## 2012-06-05 ENCOUNTER — Encounter (HOSPITAL_COMMUNITY): Payer: Self-pay | Admitting: Emergency Medicine

## 2012-06-05 DIAGNOSIS — R059 Cough, unspecified: Secondary | ICD-10-CM | POA: Insufficient documentation

## 2012-06-05 DIAGNOSIS — R05 Cough: Secondary | ICD-10-CM | POA: Insufficient documentation

## 2012-06-05 DIAGNOSIS — I10 Essential (primary) hypertension: Secondary | ICD-10-CM | POA: Insufficient documentation

## 2012-06-05 DIAGNOSIS — R51 Headache: Secondary | ICD-10-CM | POA: Insufficient documentation

## 2012-06-05 DIAGNOSIS — J3489 Other specified disorders of nose and nasal sinuses: Secondary | ICD-10-CM | POA: Insufficient documentation

## 2012-06-05 DIAGNOSIS — R509 Fever, unspecified: Secondary | ICD-10-CM

## 2012-06-05 DIAGNOSIS — Z79899 Other long term (current) drug therapy: Secondary | ICD-10-CM | POA: Insufficient documentation

## 2012-06-05 DIAGNOSIS — F17200 Nicotine dependence, unspecified, uncomplicated: Secondary | ICD-10-CM | POA: Insufficient documentation

## 2012-06-05 DIAGNOSIS — M1610 Unilateral primary osteoarthritis, unspecified hip: Secondary | ICD-10-CM | POA: Insufficient documentation

## 2012-06-05 DIAGNOSIS — J029 Acute pharyngitis, unspecified: Secondary | ICD-10-CM | POA: Insufficient documentation

## 2012-06-05 DIAGNOSIS — M25559 Pain in unspecified hip: Secondary | ICD-10-CM | POA: Insufficient documentation

## 2012-06-05 DIAGNOSIS — F259 Schizoaffective disorder, unspecified: Secondary | ICD-10-CM | POA: Insufficient documentation

## 2012-06-05 MED ORDER — SODIUM CHLORIDE 0.9 % IV BOLUS (SEPSIS)
1000.0000 mL | Freq: Once | INTRAVENOUS | Status: AC
  Administered 2012-06-05: 1000 mL via INTRAVENOUS

## 2012-06-05 MED ORDER — ACETAMINOPHEN 325 MG PO TABS
650.0000 mg | ORAL_TABLET | Freq: Once | ORAL | Status: AC
  Administered 2012-06-05: 650 mg via ORAL
  Filled 2012-06-05: qty 2

## 2012-06-05 NOTE — Progress Notes (Signed)
Pt confirms pcp is [facility] updated

## 2012-06-05 NOTE — Progress Notes (Signed)
CSW met with pt at bedside to assess transportation needs. Pt walks with a cane and [place] to far away from bus stop. Pt daughters unable to provide transportation due to working until midnight. Pt provided with cab voucher.   Catha Gosselin, LCSWA  [phone] .06/05/2012 1747pm

## 2012-06-05 NOTE — ED Notes (Signed)
Per EMS: Pt c/o weakness, cough, fever x 24 hrs.

## 2012-06-05 NOTE — ED Provider Notes (Signed)
History     CSN: [id number]  Arrival date & time 06/05/12  1429   First MD Initiated Contact with Patient 06/05/12 1513      Chief Complaint  Patient presents with  . Flu-Like Sx     (Consider location/radiation/quality/duration/timing/severity/associated sxs/prior treatment) HPI Comments: 57 y.o. Female presenting with fever, chills, sore throat, and productive cough since Sunday. BIBMES with saline lock in place.   Pain includes sore throat (severe, hurts to swallow, constant, nothing makes it better or worse, no interventions), right hip (chronic, constant, nothing makes it better or worse, no interventions), and gradual onset frontal headache (constant, throbbing, nothing makes it better or worse, no interventions)  Denies hemoptysis, chest pain, pleuritic chest pain, shortness of brath, nausea, vomiting, diarrhea, constipation, burning on urination, abdominal, pain, sick contacts.   Pt has taken no interventions. Nothing makes it better or worse.   PMHx includes HTN and schizo-affective schizophrenia.   Past Medical History  Diagnosis Date  . Hypertension   . Schizo-affective schizophrenia     Past Surgical History  Procedure Laterality Date  . Appendectomy    . Hernia repair    . Oophorectomy  unilateral    Family History  Problem Relation Age of Onset  . Hypertension Mother   . Diabetes Mother   . Heart failure Mother     History  Substance Use Topics  . Smoking status: Current Every Day Smoker    Types: Cigarettes  . Smokeless tobacco: Not on file  . Alcohol Use: No    OB History   Grav Para Term Preterm Abortions TAB SAB Ect Mult Living                  Review of Systems  Constitutional: Positive for fever. Negative for diaphoresis.  HENT: Positive for rhinorrhea. Negative for ear pain, neck pain, neck stiffness and sinus pressure.   Eyes: Negative for visual disturbance.  Respiratory: Negative for apnea, cough, chest tightness and shortness of  breath.        Chest wall pain  Cardiovascular: Negative for chest pain and palpitations.  Gastrointestinal: Negative for nausea, vomiting, abdominal pain, diarrhea and constipation.  Genitourinary: Negative for dysuria and pelvic pain.  Musculoskeletal: Positive for gait problem.       Pt walks with a cane. Right hip pain due to chronic arthritis.  Skin: Negative for rash.  Neurological: Positive for headaches. Negative for dizziness, weakness, light-headedness and numbness.       Frontal headache.    Allergies  Review of patient's allergies indicates no known allergies.  Home Medications   Current Outpatient Rx  Name  Route  Sig  Dispense  Refill  . ARIPiprazole (ABILIFY) 30 MG tablet   Oral   Take 30 mg by mouth daily.         . cyclobenzaprine (FLEXERIL) 10 MG tablet   Oral   Take 1 tablet (10 mg total) by mouth 2 (two) times daily as needed for muscle spasms.   10 tablet   0   . ibuprofen (ADVIL,MOTRIN) 200 MG tablet   Oral   Take 800 mg by mouth every 6 (six) hours as needed. For pain         . naproxen (NAPROSYN) 500 MG tablet   Oral   Take 1 tablet (500 mg total) by mouth 2 (two) times daily with a meal.   30 tablet   0     BP 111/68  Pulse 92  Temp(Src) 104.8  F (40.4 C) (Oral)  Resp 18  SpO2 100%  Physical Exam  Nursing note and vitals reviewed. Constitutional: She is oriented to person, place, and time. She appears well-developed and well-nourished. No distress.  HENT:  Head: Normocephalic and atraumatic.  TMs normal. Throat erythematous. No tonsillar exudate. Mild cervical lympadenopathy.  Eyes: Conjunctivae and EOM are normal.  Neck: Normal range of motion. Neck supple.  No meningeal signs  Cardiovascular: Normal rate, regular rhythm, normal heart sounds and intact distal pulses.  Exam reveals no gallop and no friction rub.   No murmur heard. Pulmonary/Chest: Effort normal and breath sounds normal. No respiratory distress. She has no  wheezes. She has no rales. She exhibits no tenderness.  Abdominal: Soft. Bowel sounds are normal. She exhibits no distension. There is no tenderness. There is no rebound and no guarding.  Musculoskeletal: Normal range of motion. She exhibits no edema and no tenderness.  Neurological: She is alert and oriented to person, place, and time. No cranial nerve deficit.  Skin: Skin is warm and dry. She is not diaphoretic. No erythema.    ED Course  Procedures (including critical care time)  Labs Reviewed  RAPID STREP SCREEN   Results for orders placed during the hospital encounter of 06/05/12  RAPID STREP SCREEN      Result Value Range   Streptococcus, Group A Screen (Direct) NEGATIVE  NEGATIVE    No results found.   1. Fever and chills   2. Pharyngitis       MDM  Will give Tylenol for fever, headache. Monitor vitals. Give bolus. Get rapid strep: strep v pharyngitis. And re-evaluate  Strep negative. Pt hemodynamically stable. Fluids and tylenol seemed to help as fever decreased and pt stated she was feeling better and ready to go home. Discussed taking ibuprofen for chronic hip pain and following up with primary care doctor for chronic health needs, including htn. Resource guide provided.   Will d/c with supportive tx.   Glade Nurse, PA-C 06/06/12 2330

## 2012-06-07 NOTE — ED Provider Notes (Signed)
Medical screening examination/treatment/procedure(s) were performed by non-physician practitioner and as supervising physician I was immediately available for consultation/collaboration.  Flint Melter, MD 06/07/12 2053

## 2012-07-03 ENCOUNTER — Encounter (HOSPITAL_COMMUNITY): Payer: Self-pay | Admitting: Emergency Medicine

## 2012-07-03 ENCOUNTER — Emergency Department (HOSPITAL_COMMUNITY): Payer: Medicaid Other

## 2012-07-03 ENCOUNTER — Emergency Department (HOSPITAL_COMMUNITY)
Admission: EM | Admit: 2012-07-03 | Discharge: 2012-07-03 | Disposition: A | Discharge status: Home / Self Care | Payer: Medicaid Other | Attending: Emergency Medicine | Admitting: Emergency Medicine

## 2012-07-03 DIAGNOSIS — Z79899 Other long term (current) drug therapy: Secondary | ICD-10-CM | POA: Insufficient documentation

## 2012-07-03 DIAGNOSIS — F259 Schizoaffective disorder, unspecified: Secondary | ICD-10-CM | POA: Insufficient documentation

## 2012-07-03 DIAGNOSIS — R5381 Other malaise: Secondary | ICD-10-CM | POA: Insufficient documentation

## 2012-07-03 DIAGNOSIS — F17200 Nicotine dependence, unspecified, uncomplicated: Secondary | ICD-10-CM | POA: Insufficient documentation

## 2012-07-03 DIAGNOSIS — G8929 Other chronic pain: Secondary | ICD-10-CM | POA: Insufficient documentation

## 2012-07-03 DIAGNOSIS — Z87828 Personal history of other (healed) physical injury and trauma: Secondary | ICD-10-CM | POA: Insufficient documentation

## 2012-07-03 DIAGNOSIS — M25559 Pain in unspecified hip: Secondary | ICD-10-CM | POA: Insufficient documentation

## 2012-07-03 DIAGNOSIS — I10 Essential (primary) hypertension: Secondary | ICD-10-CM | POA: Insufficient documentation

## 2012-07-03 DIAGNOSIS — R209 Unspecified disturbances of skin sensation: Secondary | ICD-10-CM | POA: Insufficient documentation

## 2012-07-03 DIAGNOSIS — M62838 Other muscle spasm: Secondary | ICD-10-CM | POA: Insufficient documentation

## 2012-07-03 MED ORDER — OXYCODONE-ACETAMINOPHEN 5-325 MG PO TABS
1.0000 | ORAL_TABLET | Freq: Once | ORAL | Status: AC
  Administered 2012-07-03: 1 via ORAL
  Filled 2012-07-03: qty 1

## 2012-07-03 MED ORDER — HYDROCODONE-ACETAMINOPHEN 5-325 MG PO TABS: 1.0000 | ORAL_TABLET | Freq: Four times a day (QID) | ORAL | Status: DC | PRN

## 2012-07-03 NOTE — ED Notes (Signed)
Pt states that she has had hip pain since dec and the tylenol and motrin is not helping. She has been dx with arthritis and that she needs muscle relaxers. Lt hip pain no injury.

## 2012-07-03 NOTE — ED Provider Notes (Signed)
History    This chart was scribed for non-physician practitioner Jaci Carrel PA-C working with Nelia Shi, MD by Smitty Pluck, ED scribe. This patient was seen in room WTR7/WTR7 and the patient's care was started at 5:39 PM.   CSN: [id number]  Arrival date & time 07/03/12  1622     Chief Complaint  Patient presents with  . Hip Pain    The history is provided by the patient. No language interpreter was used.   [name] is a 57 y.o. female with hx of HTN and arthritis who presents to the Emergency Department complaining of constant, moderate left hip pain that has been ongoing since 02/2012 due to fall. Pt reports that she has muscle spasm in left buttocks. States that she has numbness in posterior left thigh and weakness. Pt uses cane to ambulate. Pain is aggravated by bearing weight and walking. She reports that she has been seen at [facility] and [facility] multiple times and given exercises but states they did not provide relief of pain. She mentions that she has not followed up with orthopedist after being seen in ED. She reports that she has taken warm baths, ibuprofen and tylenol without relief. Pt denies injury to hip, recent fall, fever, chills, nausea, vomiting, diarrhea, weakness, cough, SOB and any other pain.   Past Medical History  Diagnosis Date  . Hypertension   . Schizo-affective schizophrenia     Past Surgical History  Procedure Laterality Date  . Appendectomy    . Hernia repair    . Oophorectomy  unilateral    Family History  Problem Relation Age of Onset  . Hypertension Mother   . Diabetes Mother   . Heart failure Mother     History  Substance Use Topics  . Smoking status: Current Every Day Smoker    Types: Cigarettes  . Smokeless tobacco: Not on file  . Alcohol Use: No    OB History   Grav Para Term Preterm Abortions TAB SAB Ect Mult Living                  Review of Systems  Constitutional: Negative for fever and chills.   Respiratory: Negative for shortness of breath.   Gastrointestinal: Negative for nausea and vomiting.  Musculoskeletal: Positive for arthralgias.  Neurological: Negative for weakness.    Allergies  Review of patient's allergies indicates no known allergies.  Home Medications   Current Outpatient Rx  Name  Route  Sig  Dispense  Refill  . acetaminophen (TYLENOL) 500 MG tablet   Oral   Take 1,000 mg by mouth every 6 (six) hours as needed for pain.         . ARIPiprazole (ABILIFY) 30 MG tablet   Oral   Take 30 mg by mouth daily.         . hydrochlorothiazide (HYDRODIURIL) 25 MG tablet   Oral   Take 25 mg by mouth daily.         Marland Kitchen ibuprofen (ADVIL,MOTRIN) 200 MG tablet   Oral   Take 400 mg by mouth every 8 (eight) hours as needed for pain.           BP 124/71  Pulse 69  Temp(Src) 98.9 F (37.2 C) (Oral)  Resp 17  SpO2 99%  Physical Exam  Nursing note and vitals reviewed. Constitutional: She appears well-developed and well-nourished. No distress.  HENT:  Head: Normocephalic and atraumatic.  Eyes: Conjunctivae and EOM are normal.  Neck:  Normal range of motion. Neck supple.  Cardiovascular:  Intact distal pulses, capillary refill < 3 seconds  Musculoskeletal:  tto at left SI and trochanteric areas. Pain induced with internal external rotation of left hip. Negative straight leg test. All other extremities with normal ROM  Neurological:  No sensory deficit. Strength 5/5 bilaterally. Ambulates w cane (since symptom onset in December)- limps.   Skin: She is not diaphoretic.  No obvious deformity     ED Course  Procedures (including critical care time) DIAGNOSTIC STUDIES: Oxygen Saturation is 99% on room air, normal by my interpretation.    COORDINATION OF CARE: 5:44 PM Discussed ED treatment with pt and pt agrees to pain medication and CT of left hip.  5:44 PM Ordered:  Medications  oxyCODONE-acetaminophen (PERCOCET/ROXICET) 5-325 MG per tablet 1 tablet  (not administered)         Labs Reviewed - No data to display No results found.   No diagnosis found.  BP 124/71  Pulse 69  Temp(Src) 98.9 F (37.2 C) (Oral)  Resp 17  SpO2 99%   MDM  57 yo F to ER c/o left hip pain that started 5 months ago s/p mechanical fall. Pt has had pain with weight bearing since injury. Neurovascularly intact on exam. She has not seen an orthopedic and OTC pain medications have not worked. Question pts ability to understand discharge instructions d/t mental baseline. Explained importance of following up with orthopedics. Previous xray negative. CT ordered to assure no missed etiology of pain. No acute abnormalities. Pt cleared for dc. Short Rx pain meds given      I personally performed the services described in this documentation, which was scribed in my presence. The recorded information has been reviewed and is accurate.       Jaci Carrel, [place] 07/03/12 1830

## 2012-07-04 NOTE — ED Provider Notes (Signed)
Medical screening examination/treatment/procedure(s) were performed by non-physician practitioner and as supervising physician I was immediately available for consultation/collaboration.   Gorge Almanza L Knute Mazzuca, MD 07/04/12 1305 

## 2013-08-26 ENCOUNTER — Emergency Department (HOSPITAL_COMMUNITY)
Admission: EM | Admit: 2013-08-26 | Discharge: 2013-08-26 | Disposition: A | Discharge status: Home / Self Care | Payer: Medicaid Other | Attending: Emergency Medicine | Admitting: Emergency Medicine

## 2013-08-26 ENCOUNTER — Encounter (HOSPITAL_COMMUNITY): Payer: Self-pay | Admitting: Emergency Medicine

## 2013-08-26 DIAGNOSIS — Z79899 Other long term (current) drug therapy: Secondary | ICD-10-CM | POA: Insufficient documentation

## 2013-08-26 DIAGNOSIS — H5710 Ocular pain, unspecified eye: Secondary | ICD-10-CM | POA: Insufficient documentation

## 2013-08-26 DIAGNOSIS — B9789 Other viral agents as the cause of diseases classified elsewhere: Secondary | ICD-10-CM | POA: Insufficient documentation

## 2013-08-26 DIAGNOSIS — F17200 Nicotine dependence, unspecified, uncomplicated: Secondary | ICD-10-CM | POA: Insufficient documentation

## 2013-08-26 DIAGNOSIS — F259 Schizoaffective disorder, unspecified: Secondary | ICD-10-CM | POA: Insufficient documentation

## 2013-08-26 DIAGNOSIS — J3489 Other specified disorders of nose and nasal sinuses: Secondary | ICD-10-CM | POA: Insufficient documentation

## 2013-08-26 DIAGNOSIS — I10 Essential (primary) hypertension: Secondary | ICD-10-CM | POA: Insufficient documentation

## 2013-08-26 DIAGNOSIS — B349 Viral infection, unspecified: Secondary | ICD-10-CM

## 2013-08-26 LAB — CBG MONITORING, ED: GLUCOSE-CAPILLARY: 75 mg/dL (ref 70–99)

## 2013-08-26 MED ORDER — IBUPROFEN 800 MG PO TABS
800.0000 mg | ORAL_TABLET | Freq: Once | ORAL | Status: AC
  Administered 2013-08-26: 800 mg via ORAL
  Filled 2013-08-26: qty 1

## 2013-08-26 NOTE — ED Notes (Addendum)
Pt reports headache and feeling dizzy starting last night, finally went to bed and when she woke up her right eye was swollen shut. Pain 7/10 at present. Last night 10/10.  Pt wants to make sure she will not go blind in the eye. Hand grip strong. Pt does not have teeth in and unsure if smile is slightly asymetrical due to that. Reports she is still dizzy. Denies n/v/d.  Requesting to get her blood sugar checked because of her "head spinning."

## 2013-08-26 NOTE — ED Provider Notes (Signed)
CSN: [id number]     Arrival date & time 08/26/13  0815 History   First MD Initiated Contact with Patient 08/26/13 [phone]     Chief Complaint  Patient presents with  . Headache  . Eye Pain     (Consider location/radiation/quality/duration/timing/severity/associated sxs/prior Treatment) HPI Comments: Awoke with R eye swelling and headache. Had to place a cold rag on her eye to get her eye open as her eyelashes were matted shut. Mild nasal congestion yesterday. Normal vision. No fever, no sore throat. No N/V/D, no abdominal pain. No CP or SOB.    Patient is a 58 y.o. female presenting with headaches and eye pain. The history is provided by the patient.  Headache Location: Behind R eye. Quality:  Dull Radiates to:  Does not radiate Onset quality:  Sudden Duration:  2 hours Timing:  Constant Progression:  Improving Chronicity:  New Similar to prior headaches: no   Associated symptoms: congestion (mild, yesterday) and eye pain   Associated symptoms: no abdominal pain, no cough, no fever, no sinus pressure, no sore throat and no vomiting   Eye Pain Associated symptoms include headaches. Pertinent negatives include no chest pain, no abdominal pain and no shortness of breath.    Past Medical History  Diagnosis Date  . Hypertension   . Schizo-affective schizophrenia    Past Surgical History  Procedure Laterality Date  . Appendectomy    . Hernia repair    . Oophorectomy  unilateral   Family History  Problem Relation Age of Onset  . Hypertension Mother   . Diabetes Mother   . Heart failure Mother    History  Substance Use Topics  . Smoking status: Current Every Day Smoker    Types: Cigarettes  . Smokeless tobacco: Not on file  . Alcohol Use: No   OB History   Grav Para Term Preterm Abortions TAB SAB Ect Mult Living                 Review of Systems  Constitutional: Negative for fever and chills.  HENT: Positive for congestion (mild, yesterday). Negative for sinus  pressure, sneezing and sore throat.   Eyes: Positive for pain.  Respiratory: Negative for cough and shortness of breath.   Cardiovascular: Negative for chest pain and leg swelling.  Gastrointestinal: Negative for vomiting and abdominal pain.  Neurological: Positive for headaches.  All other systems reviewed and are negative.     Allergies  Review of patient's allergies indicates no known allergies.  Home Medications   Prior to Admission medications   Medication Sig Start Date End Date Taking? Authorizing Provider  acetaminophen (TYLENOL) 500 MG tablet Take 1,000 mg by mouth every 6 (six) hours as needed for pain.    Historical Provider, MD  ARIPiprazole (ABILIFY) 30 MG tablet Take 30 mg by mouth daily.    Historical Provider, MD  hydrochlorothiazide (HYDRODIURIL) 25 MG tablet Take 25 mg by mouth daily.    Historical Provider, MD  HYDROcodone-acetaminophen (NORCO/VICODIN) 5-325 MG per tablet Take 1 tablet by mouth every 6 (six) hours as needed for pain. 07/03/12   Lisette Paz, PA-C  ibuprofen (ADVIL,MOTRIN) 200 MG tablet Take 400 mg by mouth every 8 (eight) hours as needed for pain.    Historical Provider, MD   BP 113/66  Pulse 64  Temp(Src) 98.5 F (36.9 C) (Oral)  Resp 18  SpO2 98% Physical Exam  Nursing note and vitals reviewed. Constitutional: She is oriented to person, place, and time. She appears well-developed  and well-nourished. No distress.  HENT:  Head: Normocephalic and atraumatic.  Right Ear: Tympanic membrane normal.  Left Ear: Tympanic membrane normal.  Nose: No rhinorrhea or nose lacerations. Right sinus exhibits no maxillary sinus tenderness and no frontal sinus tenderness. Left sinus exhibits no maxillary sinus tenderness and no frontal sinus tenderness.    Mouth/Throat: Oropharynx is clear and moist.  Eyes: EOM are normal. Pupils are equal, round, and reactive to light. Right eye exhibits no chemosis, no discharge, no exudate and no hordeolum. No foreign body  present in the right eye. Left eye exhibits no chemosis, no discharge, no exudate and no hordeolum. No foreign body present in the left eye. Right conjunctiva is not injected. Right conjunctiva has no hemorrhage. Left conjunctiva is not injected. Left conjunctiva has no hemorrhage. No scleral icterus. Right eye exhibits normal extraocular motion. Left eye exhibits normal extraocular motion. Right pupil is reactive. Left pupil is reactive.  Neck: Normal range of motion. Neck supple.  Cardiovascular: Normal rate and regular rhythm.  Exam reveals no friction rub.   No murmur heard. Pulmonary/Chest: Effort normal and breath sounds normal. No respiratory distress. She has no wheezes. She has no rales.  Abdominal: Soft. She exhibits no distension. There is no tenderness. There is no rebound.  Musculoskeletal: Normal range of motion. She exhibits no edema.  Neurological: She is alert and oriented to person, place, and time.  Skin: She is not diaphoretic.    ED Course  Procedures (including critical care time) Labs Review Labs Reviewed - No data to display  Imaging Review No results found.   EKG Interpretation None      MDM   Final diagnoses:  Viral syndrome    16F with hx of HTN, schizo-affective disorder presents with eye swelling and discharge. All resolved at this time. Mild R sided facial pain at medial corner of R eye. This morning had to hold a cold rag on her eye to get her eye open. She states her eyelashes were matted together. States no vision changes, no fever. Mild nasal congestion yesterday. On exam, AFVSS. Bilateral eyes with normal pupils, normal EOMs. Patient has no evidence of swelling to R eye. No conjunctivitis. Remainder of HEENT exam normal.  Symptoms consistent with viral syndrome - likely had some mild nasal congestion that backed up into her eye. All symptoms resolved now. Stable for discharge. Has appt with PCP in 3 days.  Dagmar HaitWilliam Kalieb Freeland, MD 08/26/13 [phone]

## 2013-08-26 NOTE — Discharge Instructions (Signed)

## 2014-05-06 ENCOUNTER — Emergency Department (HOSPITAL_COMMUNITY): Payer: Medicaid Other

## 2014-05-06 ENCOUNTER — Encounter (HOSPITAL_COMMUNITY): Payer: Self-pay | Admitting: Neurology

## 2014-05-06 ENCOUNTER — Emergency Department (HOSPITAL_COMMUNITY)
Admission: EM | Admit: 2014-05-06 | Discharge: 2014-05-06 | Disposition: A | Discharge status: Home / Self Care | Payer: Medicaid Other | Attending: Emergency Medicine | Admitting: Emergency Medicine

## 2014-05-06 DIAGNOSIS — Z79899 Other long term (current) drug therapy: Secondary | ICD-10-CM | POA: Insufficient documentation

## 2014-05-06 DIAGNOSIS — Z720 Tobacco use: Secondary | ICD-10-CM | POA: Insufficient documentation

## 2014-05-06 DIAGNOSIS — S62101A Fracture of unspecified carpal bone, right wrist, initial encounter for closed fracture: Secondary | ICD-10-CM

## 2014-05-06 DIAGNOSIS — S59911A Unspecified injury of right forearm, initial encounter: Secondary | ICD-10-CM | POA: Diagnosis present

## 2014-05-06 DIAGNOSIS — S52611A Displaced fracture of right ulna styloid process, initial encounter for closed fracture: Secondary | ICD-10-CM | POA: Diagnosis not present

## 2014-05-06 DIAGNOSIS — Y9289 Other specified places as the place of occurrence of the external cause: Secondary | ICD-10-CM | POA: Insufficient documentation

## 2014-05-06 DIAGNOSIS — Y998 Other external cause status: Secondary | ICD-10-CM | POA: Insufficient documentation

## 2014-05-06 DIAGNOSIS — I10 Essential (primary) hypertension: Secondary | ICD-10-CM | POA: Insufficient documentation

## 2014-05-06 DIAGNOSIS — F250 Schizoaffective disorder, bipolar type: Secondary | ICD-10-CM | POA: Diagnosis not present

## 2014-05-06 DIAGNOSIS — W010XXA Fall on same level from slipping, tripping and stumbling without subsequent striking against object, initial encounter: Secondary | ICD-10-CM | POA: Insufficient documentation

## 2014-05-06 DIAGNOSIS — Y9301 Activity, walking, marching and hiking: Secondary | ICD-10-CM | POA: Diagnosis not present

## 2014-05-06 DIAGNOSIS — S52511A Displaced fracture of right radial styloid process, initial encounter for closed fracture: Secondary | ICD-10-CM | POA: Diagnosis not present

## 2014-05-06 MED ORDER — OXYCODONE-ACETAMINOPHEN 5-325 MG PO TABS
1.0000 | ORAL_TABLET | Freq: Once | ORAL | Status: AC
  Administered 2014-05-06: 1 via ORAL
  Filled 2014-05-06: qty 1

## 2014-05-06 MED ORDER — OXYCODONE-ACETAMINOPHEN 5-325 MG PO TABS: ORAL_TABLET | ORAL | Status: DC

## 2014-05-06 NOTE — Progress Notes (Signed)
Orthopedic Tech Progress Note Patient Details:  Linda PennaDebra Weeks 1955/12/17 [id number]  Ortho Devices Type of Ortho Device: Ace wrap, Arm sling, Sugartong splint Ortho Device/Splint Location: rue Ortho Device/Splint Interventions: Application   [name] 05/06/2014, 11:02 AM

## 2014-05-06 NOTE — ED Notes (Signed)
Pt reports fall yesterday, c/o right forearm/wrist pain. No LOC. Swelling to wrist noted.

## 2014-05-06 NOTE — Discharge Instructions (Signed)
Rest, Ice intermittently (in the first 24-48 hours), Gentle compression with an Ace wrap, and elevate (Limb above the level of the heart)   Take percocet for breakthrough pain, do not drink alcohol, drive, care for children or do other critical tasks while taking percocet.   Cast or Splint Care Casts and splints support injured limbs and keep bones from moving while they heal.  HOME CARE  Keep the cast or splint uncovered during the drying period.  A plaster cast can take 24 to 48 hours to dry.  A fiberglass cast will dry in less than 1 hour.  Do not rest the cast on anything harder than a pillow for 24 hours.  Do not put weight on your injured limb. Do not put pressure on the cast. Wait for your doctor's approval.  Keep the cast or splint dry.  Cover the cast or splint with a plastic bag during baths or wet weather.  If you have a cast over your chest and belly (trunk), take sponge baths until the cast is taken off.  If your cast gets wet, dry it with a towel or blow dryer. Use the cool setting on the blow dryer.  Keep your cast or splint clean. Wash a dirty cast with a damp cloth.  Do not put any objects under your cast or splint.  Do not scratch the skin under the cast with an object. If itching is a problem, use a blow dryer on a cool setting over the itchy area.  Do not trim or cut your cast.  Do not take out the padding from inside your cast.  Exercise your joints near the cast as told by your doctor.  Raise (elevate) your injured limb on 1 or 2 pillows for the first 1 to 3 days. GET HELP IF:  Your cast or splint cracks.  Your cast or splint is too tight or too loose.  You itch badly under the cast.  Your cast gets wet or has a soft spot.  You have a bad smell coming from the cast.  You get an object stuck under the cast.  Your skin around the cast becomes red or sore.  You have new or more pain after the cast is put on. GET HELP RIGHT AWAY IF:  You  have fluid leaking through the cast.  You cannot move your fingers or toes.  Your fingers or toes turn blue or white or are cool, painful, or puffy (swollen).  You have tingling or lose feeling (numbness) around the injured area.  You have bad pain or pressure under the cast.  You have trouble breathing or have shortness of breath.  You have chest pain. Document Released: 07/13/2010 Document Revised: 11/13/2012 Document Reviewed: 09/19/2012 [facility] Patient Information 2015 [employer]. This information is not intended to replace advice given to you by your health care provider. Make sure you discuss any questions you have with your health care provider.

## 2014-05-06 NOTE — ED Provider Notes (Signed)
CSN: [id number]     Arrival date & time 05/06/14  4098 History  This chart was scribed for non-physician practitioner Wynetta Emery, PA-C working with Samuel Jester, DO by Littie Deeds, ED Scribe. This patient was seen in room TR05C/TR05C and the patient's care was started at 10:40 AM.       Chief Complaint  Patient presents with  . Arm Pain   HPI HPI Comments: [name] is a 59 y.o. right hand dominant female with a hx of HTN and schizo-affective schizophrenia who presents to the Emergency Department complaining of sudden onset right forearm and wrist pain secondary to a fall that occurred yesterday while walking down a hill. She states that her legs gave out on her, causing her to fall and land on her right arm/hand. Patient took some Tylenol for her pain. She rates her pain at 9 out of 10, states it is his is exacerbated by movement and palpation. Patient denies LOC and head injury. She was brought here today by a friend. Patient is right-hand-dominant.  Past Medical History  Diagnosis Date  . Hypertension   . Schizo-affective schizophrenia    Past Surgical History  Procedure Laterality Date  . Appendectomy    . Hernia repair    . Oophorectomy  unilateral   Family History  Problem Relation Age of Onset  . Hypertension Mother   . Diabetes Mother   . Heart failure Mother    History  Substance Use Topics  . Smoking status: Current Every Day Smoker    Types: Cigarettes  . Smokeless tobacco: Not on file  . Alcohol Use: No   OB History    No data available     Review of Systems A complete 10 system review of systems was obtained and all systems are negative except as noted in the HPI and PMH.     Allergies  Review of patient's allergies indicates no known allergies.  Home Medications   Prior to Admission medications   Medication Sig Start Date End Date Taking? Authorizing Provider  ARIPiprazole (ABILIFY) 30 MG tablet Take 30 mg by mouth daily.    Historical  Provider, MD  hydrochlorothiazide (HYDRODIURIL) 25 MG tablet Take 25 mg by mouth daily.    Historical Provider, MD  ibuprofen (ADVIL,MOTRIN) 200 MG tablet Take 400 mg by mouth every 8 (eight) hours as needed for pain.    Historical Provider, MD  Ledipasvir-Sofosbuvir (HARVONI) 90-400 MG TABS Take 1 tablet by mouth daily.    Historical Provider, MD   BP 136/88 mmHg  Pulse 73  Temp(Src) 98.5 F (36.9 C) (Oral)  Resp 15  SpO2 98% Physical Exam  Constitutional: She is oriented to person, place, and time. She appears well-developed and well-nourished. No distress.  HENT:  Head: Normocephalic and atraumatic.  Mouth/Throat: Oropharynx is clear and moist. No oropharyngeal exudate.  Eyes: Pupils are equal, round, and reactive to light.  Neck: Neck supple.  Cardiovascular: Normal rate.   Pulses:      Radial pulses are 2+ on the right side.  Pulmonary/Chest: Effort normal.  Musculoskeletal: She exhibits no edema.  Swollen and diffusely TTP along dorsum of right wrist. overlying skin is intact. Excellent ROM to all fingers.   Neurological: She is alert and oriented to person, place, and time. No cranial nerve deficit.  Neurovascularly intact.  Skin: Skin is warm and dry. No rash noted.  Psychiatric: She has a normal mood and affect. Her behavior is normal.  Nursing note and vitals reviewed.  ED Course  Procedures   SPLINT APPLICATION Date/Time: 11:30 AM Authorized by: Wynetta Emery Consent: Verbal consent obtained. Risks and benefits: risks, benefits and alternatives were discussed Consent given by: patient Splint applied by: orthopedic technician Location details: right wrist Splint type: sugar tong Supplies used: Ortho-Glass and padding along with swelling.  Post-procedure: The splinted body part was neurovascularly unchanged following the procedure. Patient tolerance: Patient tolerated the procedure well with no immediate complications.    DIAGNOSTIC STUDIES: Oxygen  Saturation is 98% on room air, normal by my interpretation.    COORDINATION OF CARE: 10:44 AM-Discussed treatment plan which includes hand specialist referral, splint and pain medication with pt at bedside and pt agreed to plan.    Labs Review Labs Reviewed - No data to display  Imaging Review Dg Forearm Right  05/06/2014   CLINICAL DATA:  Tripped, fell forward, landing on right hand and arm. Apparent deformity and swelling at distal right forearm and wrist. Initial encounter.  EXAM: RIGHT FOREARM - 2 VIEW  COMPARISON:  None.  FINDINGS: There is a mildly displaced distal right radial fracture with mild comminution. Probable intra-articular extension. Ulnar styloid fracture also noted. No additional acute bony abnormality.  IMPRESSION: Mildly displaced comminuted distal right radial fracture.  Ulnar styloid fracture.   Electronically Signed   By: Charlett Nose M.D.   On: 05/06/2014 10:22   Dg Hand Complete Right  05/06/2014   CLINICAL DATA:  Tripped and fell forward, landing on outstretched hand. Deformity, swelling, pain. In search first  EXAM: RIGHT HAND - COMPLETE 3+ VIEW  COMPARISON:  Right forearm series performed today.  FINDINGS: There is a distal right radial metaphyseal fracture with probable intra-articular extension. Minimal displacement. Mildly displaced ulnar styloid fracture. Overlying soft tissue swelling noted.  No additional acute bony abnormality.  IMPRESSION: Distal right radial fracture and ulnar styloid fracture.   Electronically Signed   By: Charlett Nose M.D.   On: 05/06/2014 10:23     EKG Interpretation None      MDM   Final diagnoses:  Right wrist fracture, closed, initial encounter    Filed Vitals:   05/06/14 0950 05/06/14 1123  BP: 136/88 99/87  Pulse: 73 65  Temp: 98.5 F (36.9 C) 98.8 F (37.1 C)  TempSrc: Oral Oral  Resp: 15 16  SpO2: 98% 99%    Medications  oxyCODONE-acetaminophen (PERCOCET/ROXICET) 5-325 MG per tablet 1 tablet (1 tablet Oral Given  05/06/14 1039)    [name] is a pleasant 59 y.o. female presenting with right (dominant) wrist pain status post slip and fall yesterday. Patient is neurovascularly intact. There was no other trauma. Patient has a intra-articular comminuted mildly displaced radial fracture. There is also an avulsion fracture on the ulna. Case is discussed with attending physician in addition to hand surgeon Dr. Janee Morn: He will follow-up with her in the office next week.   Evaluation does not show pathology that would require ongoing emergent intervention or inpatient treatment. Pt is hemodynamically stable and mentating appropriately. Discussed findings and plan with patient/guardian, who agrees with care plan. All questions answered. Return precautions discussed and outpatient follow up given.   New Prescriptions   OXYCODONE-ACETAMINOPHEN (PERCOCET/ROXICET) 5-325 MG PER TABLET    1 to 2 tabs PO q6hrs  PRN for pain    I personally performed the services described in this documentation, which was scribed in my presence. The recorded information has been reviewed and is accurate.    Wynetta Emery, PA-C 05/06/14 1132  Nicholos Johns  Clarene DukeMcManus, DO 05/06/14 1946

## 2014-05-15 HISTORY — PX: OTHER SURGICAL HISTORY: SHX169

## 2014-05-19 ENCOUNTER — Encounter: Payer: Self-pay | Admitting: Occupational Therapy

## 2014-05-19 ENCOUNTER — Ambulatory Visit: Payer: Medicaid Other | Attending: Orthopedic Surgery | Admitting: Occupational Therapy

## 2014-05-19 DIAGNOSIS — M25631 Stiffness of right wrist, not elsewhere classified: Secondary | ICD-10-CM

## 2014-05-19 DIAGNOSIS — M25531 Pain in right wrist: Secondary | ICD-10-CM | POA: Insufficient documentation

## 2014-05-19 DIAGNOSIS — Z4789 Encounter for other orthopedic aftercare: Secondary | ICD-10-CM | POA: Insufficient documentation

## 2014-05-19 NOTE — Patient Instructions (Signed)
AROM: Elbow Flexion / Extension    Gently bend elbow as far as possible. Then straighten arm as far as possible. Repeat _10-15___ times per set. Do _4-6___ sessions per day.    Elbow / Wrist Supination / Pronation   Bend elbow(s) to 90 and hold close to body. Turn palm(s) up. Then turn palm(s) down. Keep wrist straight. Repeat sequence _10-15___ times per session. Do _4-6___ sessions per day.   Flexor Tendon Gliding (Active Hook Fist)   With fingers and knuckles straight, bend middle and tip joints. Do not bend large knuckles. Repeat _10-15___ times. Do _4-6___ sessions per day.    Finger Flexion / Extension   With palm up, bend fingers of left hand toward palm, making a  fist. Straighten fingers, opening fist. Repeat sequence _10-15___ times per session. Do _4-6__ sessions per day. Hand Variation: Palm down        SPLINT WEAR AND CARE   WEARING SCHEDULE:  Wear splint at ALL times except for hygiene care   PURPOSE:  To prevent movement AT WRIST and for protection until injury can heal  CARE OF SPLINT:  Keep splint away from heat sources including: stove, radiator or furnace, or a car in sunlight. The splint can melt and will no longer fit you properly  Keep away from pets and children  Clean the splint with rubbing alcohol 1-2 times per day.  * During this time, make sure you also clean your hand/arm as instructed by your therapist and/or perform dressing changes as needed. Then dry hand/arm completely before replacing splint. (When cleaning hand/arm, keep it immobilized in same position until splint is replaced)  PRECAUTIONS/POTENTIAL PROBLEMS: *If you notice or experience increased pain, swelling, numbness, or a lingering reddened area from the splint: Contact your therapist immediately by calling [phone]. You must wear the splint for protection, but we will get you scheduled for adjustments as quickly as possible.  (If only straps or hooks need to be replaced  and NO adjustments to the splint need to be made, just call the office ahead and let them know you are coming in)  If you have any medical concerns or signs of infection, please call your doctor immediately

## 2014-05-19 NOTE — Therapy (Signed)
[facility] [address] [place], [place], [address] Phone: [phone]   Fax:  [phone]  Occupational Therapy Evaluation  Patient Details  Name: [name] MRN: [id number] Date of Birth: 07/12/1955 Referring Provider:  Jodi Marble, MD  Encounter Date: 05/19/2014      OT End of Session - 05/19/14 1137    Visit Number 1   Date for OT Re-Evaluation 08/15/14   Authorization Type Awaiting MCD authorization   OT Start Time 1030   OT Stop Time 1130   OT Time Calculation (min) 60 min   Activity Tolerance Patient tolerated treatment well      Past Medical History  Diagnosis Date  . Hypertension   . Schizo-affective schizophrenia     Past Surgical History  Procedure Laterality Date  . Appendectomy    . Hernia repair    . Oophorectomy  unilateral  . Orif rt distal radius Right 05/15/14    There were no vitals taken for this visit.  Visit Diagnosis:  Wrist pain, acute, right - Plan: Ot plan of care cert/re-cert  Stiffness of wrist joint, right - Plan: Ot plan of care cert/re-cert      Subjective Assessment - 05/19/14 1040    Pertinent History ORIF Rt distal radius 05/15/14   Repetition Increases Symptoms   Patient Stated Goals Be able to use my hand again   Currently in Pain? Yes   Pain Score 3    Pain Location Wrist   Pain Orientation Right   Pain Descriptors / Indicators Tingling;Aching   Pain Type Acute pain;Surgical pain   Pain Onset In the past 7 days   Aggravating Factors  movement   Pain Relieving Factors pain meds          [facility] OT Assessment - 05/19/14 1042    Assessment   Diagnosis s/p ORIF Rt distal radius fx (25400). Surgery on 05/15/14. (Pt's dominant hand affected)   Onset Date 05/15/14   Prior Therapy n/a   Precautions   Precautions --  will need clarification on ROM to wrist   Home  Environment   Additional Comments lives on 1st floor apt   Lives With Alone  however  daughter staying with pt since surgery    Prior Function   Level of Independence Independent with basic ADLs;Independent with homemaking with ambulation   Vocation Unemployed;On disability   ADL   Eating/Feeding Modified independent  with Lt hand   Grooming --  min assist for styling hair   Upper Body Bathing Modified independent   Lower Body Bathing Increased time   Upper Body Dressing Minimal assistance  to hook bra   Lower Body Dressing Modified independent  elastic pants, keep shoes tied   ADL comments Pt requires assist from daughter for IADLS, especially cooking   Written Expression   Dominant Hand Right   Handwriting --  not tested   Edema   Edema mild Rt hand   ROM / Strength   AROM / PROM / Strength AROM   AROM   Overall AROM Comments elbow flex WFL's, extension stiff w/ difficulty achieving end range. Supination and pronation limitied, pt has approx. 60% motion. Wrist not assessed d/t current precautions until clarification w/ MD. Gross finger flexion/extension WFL's except limited thumb mobility                OT Treatments/Exercises (OP) - 05/19/14 1118    ADLs   ADL Comments Removed soft cast from surgery and  gently cleaned hand, wrist and forearm avoiding stitches and keeping wrist immobolized. Lightly cleaned stitches w/ saline. Dried arm completely then placed on clean stockinette before fabricating splint. Pt issued 3 extra stockinette.    Exercises   Exercises --  Pt shown elbow flex/ext, sup/pron., and gross finger flexion and instructed to do with splint on.    Splinting   Splinting Fabricated and fitted volar wrist splint per MD orders. Pt instructed in precautions, wear and care of splint               OT Education - 05/19/14 1122    Education provided Yes   Education Details SPLINT WEAR AND CARE, PRECAUTIONS, AROM to elbow, forearm, and fingers (currently NOT to involved joint - wrist)   Person(s) Educated Patient   Methods  Explanation;Demonstration;Handout   Comprehension Verbalized understanding;Returned demonstration          OT Short Term Goals - 05/19/14 1144    OT SHORT TERM GOAL #1   Title Independent w/ splint wear and care (due 06/17/14)   Baseline Issued at eval but may need adjustments   Time 4   Period Weeks   Status New   OT SHORT TERM GOAL #2   Title Independent w/ initial HEP   Baseline Issued, but may need review and updateds to include wrist AROM   Time 4   Period Weeks   Status New           OT Long Term Goals - 05/19/14 1145    OT LONG TERM GOAL #1   Title Independent w/ updated strengthening HEP (due 08/15/14)   Baseline dependent d/t current precautions   Time 12   Period Weeks   Status New   OT LONG TERM GOAL #2   Title Pain less than or equal to 2/10 w/ all self care and functional task s   Baseline 3/10 at rest with pain meds   Time 12   Period Weeks   Status New   OT LONG TERM GOAL #3   Title Pt to return to using Rt hand as dominant hand for all self care tasks   Baseline dependent, using Lt non dominant hand   Time 12   Period Weeks   Status New   OT LONG TERM GOAL #4   Title Pt to demo 90% supination, pronation and wrist flexion and extension Rt hand    Baseline supination and pronation approx 60%, unable to assess wrist d/t current precautions   Time 12   Period Weeks   Status New   OT LONG TERM GOAL #5   Title Grip strenth RT hand to be 20 lbs or greater    Baseline dependent d/t current precautions   Time 12   Period Weeks   Status New               Plan - 05/19/14 1138    Clinical Impression Statement Pt is a 59 y.o. female who arrived today fully wrapped and protected s/p ORIF Rt distal radius fx ([address]) on 05/15/14 for: splinting purposes and to begin rehab program per MD order. Order to fabricate volar wrist splint and begin rehab. Pt was seen for fabrication and fitting of splint, splint wear and care instructions, education in  precautions, hygiene care, and initial HEP for A/ROM to uninvolved joints.    Pt will benefit from skilled therapeutic intervention in order to improve on the following deficits (Retired) Increased edema;Decreased knowledge of precautions;Impaired UE functional  use;Decreased strength;Pain   Rehab Potential Fair   Clinical Impairments Affecting Rehab Potential Due to MCD visit limit   OT Frequency --  3 visits over 12 week duration   OT Treatment/Interventions Self-care/ADL training;Moist Heat;Fluidtherapy;DME and/or AE instruction;Splinting;Patient/family education;Therapeutic exercises;Therapeutic activities;Scar mobilization;Passive range of motion;Electrical Stimulation;Manual Therapy;Therapeutic exercise   Plan splint assessment, review initial HEP, f/u w/ MD re: ? to begin AROM to wrist, if OK assess wrist ROM, check for MCD authorization   Consulted and Agree with Plan of Care Patient        Problem List Patient Active Problem List   Diagnosis Date Noted  . Schizo-affective schizophrenia     Kelli ChurnBallie, [place] Ndiaye Johnson, OTR/L 05/19/2014, 12:03 PM  [facility] [address] [place], [place], [address] Phone: [phone]   Fax:  [phone]

## 2014-05-19 NOTE — Therapy (Signed)
[facility] [address] [place], [place], [address] Phone: [phone]   Fax:  [phone]  Patient Details  Name: [name] MRN: [id number] Date of Birth: 1955-04-03 Referring Provider:  No ref. provider found  Encounter Date: 05/19/2014  Dr. Mack Hookavid Thompson  Do you want to begin A/ROM to Rt wrist immediately for this patient? If not, when can she begin A/ROM to wrist? Her surgery (ORIF Rt distal radius fx) was on 05/15/14. I have given her A/ROM HEP to uninvolved joints at evaluation on 05/19/14.   Please return clarification via EPIC or fax. Thank you  Kelli ChurnBallie, Jonaven Hilgers Johnson, OTR/L 05/19/2014, 12:18 PM  [facility] [address] [place], [place], [address] Phone: [phone]   Fax:  [phone]

## 2014-05-25 ENCOUNTER — Telehealth: Payer: Self-pay | Admitting: Occupational Therapy

## 2014-05-25 NOTE — Telephone Encounter (Signed)
Fax received from MD which stated ok to begin A/ROM right away as well as moderate PROM to wrist. Pt was called and informed to begin A/ROM to wrist (along with elbow, forearm and finger ROM already provided). Pt instructed to replace splint back on after completing exercises; pt to perform ex's 4-6 times/day. Pt verbalized understanding, however needed clarification x2 via telephone.  Plan: to begin P/ROM at next scheduled appointment secondary to pt requiring demonstration to perform properly, and issue A/ROM and P/ROM HEP to wrist

## 2014-06-02 ENCOUNTER — Ambulatory Visit: Payer: Medicaid Other | Attending: Orthopedic Surgery | Admitting: Occupational Therapy

## 2014-06-02 ENCOUNTER — Encounter: Payer: Self-pay | Admitting: Occupational Therapy

## 2014-06-02 DIAGNOSIS — M25531 Pain in right wrist: Secondary | ICD-10-CM | POA: Insufficient documentation

## 2014-06-02 DIAGNOSIS — Z4789 Encounter for other orthopedic aftercare: Secondary | ICD-10-CM | POA: Insufficient documentation

## 2014-06-02 DIAGNOSIS — M25631 Stiffness of right wrist, not elsewhere classified: Secondary | ICD-10-CM

## 2014-06-02 NOTE — Therapy (Signed)
[facility] [address] [place], [place], [address] Phone: [phone]   Fax:  [phone]  Occupational Therapy Treatment  Patient Details  Name: [name] MRN: [id number] Date of Birth: 10/08/1955 Referring Provider:  Mack Hook, MD  Encounter Date: 06/02/2014      OT End of Session - 06/02/14 1054    Visit Number 2   Date for OT Re-Evaluation 08/15/14   Authorization Type MCD approved 3 visits    Authorization Time Period 05/20/14 - 08/17/14   Authorization - Visit Number 1   Authorization - Number of Visits 3   OT Start Time 1015   OT Stop Time 1100   OT Time Calculation (min) 45 min   Activity Tolerance Patient tolerated treatment well      Past Medical History  Diagnosis Date  . Hypertension   . Schizo-affective schizophrenia     Past Surgical History  Procedure Laterality Date  . Appendectomy    . Hernia repair    . Oophorectomy  unilateral  . Orif rt distal radius Right 05/15/14    There were no vitals taken for this visit.  Visit Diagnosis:  Stiffness of wrist joint, right      Subjective Assessment - 06/02/14 1020    Symptoms The splint feels fine, but I haven't been wearing all the time. (Therapist instructed pt to wear splint between exercises during day, and at night. Pt agreed)   Pertinent History ORIF Rt distal radius 05/15/14   Repetition Increases Symptoms   Patient Stated Goals Be able to use my hand again   Currently in Pain? Yes   Pain Score 5    Pain Location Wrist  on sides   Pain Orientation Right   Pain Descriptors / Indicators Aching;Tingling   Pain Type Acute pain   Pain Onset 1 to 4 weeks ago          [facility] OT Assessment - 06/02/14 1052    ROM / Strength   AROM / PROM / Strength AROM  wrist flex = 35*, ext = 28*. Sup = 80*, pron = 70*               OT Treatments/Exercises (OP) - 06/02/14 0001    Exercises   Exercises Wrist   Wrist Exercises   Other wrist exercises see pt instructions - pt performed each 5-10 reps                OT Education - 06/02/14 1044    Education provided Yes   Education Details HEP for: wrist A/ROM (instructed to perform via telephone encounter previously), wrist P/ROM, forearm P/ROM, and thumb A/ROM, thumb P/ROM    Person(s) Educated Patient   Methods Explanation;Demonstration;Handout   Comprehension Verbalized understanding;Returned demonstration          OT Short Term Goals - 05/19/14 1144    OT SHORT TERM GOAL #1   Title Independent w/ splint wear and care (due 06/17/14)   Baseline Issued at eval but may need adjustments   Time 4   Period Weeks   Status New   OT SHORT TERM GOAL #2   Title Independent w/ initial HEP   Baseline Issued, but may need review and updateds to include wrist AROM   Time 4   Period Weeks   Status New           OT Long Term Goals - 05/19/14 1145    OT LONG TERM GOAL #1  Title Independent w/ updated strengthening HEP (due 08/15/14)   Baseline dependent d/t current precautions   Time 12   Period Weeks   Status New   OT LONG TERM GOAL #2   Title Pain less than or equal to 2/10 w/ all self care and functional task s   Baseline 3/10 at rest with pain meds   Time 12   Period Weeks   Status New   OT LONG TERM GOAL #3   Title Pt to return to using Rt hand as dominant hand for all self care tasks   Baseline dependent, using Lt non dominant hand   Time 12   Period Weeks   Status New   OT LONG TERM GOAL #4   Title Pt to demo 90% supination, pronation and wrist flexion and extension Rt hand    Baseline supination and pronation approx 60%, unable to assess wrist d/t current precautions   Time 12   Period Weeks   Status New   OT LONG TERM GOAL #5   Title Grip strenth RT hand to be 20 lbs or greater    Baseline dependent d/t current precautions   Time 12   Period Weeks   Status New               Plan - 06/02/14 1058    Clinical  Impression Statement Pt tolerating A/ROM and P/ROM to wrist and forearm (ok per MD) with increase in pain during P/ROM. Pt also stiff at thumb and therefore issued thumb A/ROM and P/ROM ex's as well   Plan Review previously issued HEP, continue A/ROM, P/ROM. (Schedule 1 more appt 2 wks from last scheduled appt to begin strengthening at that time.)   Consulted and Agree with Plan of Care Patient        Problem List Patient Active Problem List   Diagnosis Date Noted  . Schizo-affective schizophrenia     Kelli ChurnBallie, Jahnavi Muratore Johnson, OTR/L 06/02/2014, 11:03 AM  [facility] [address] [place], [place], [address] Phone: [phone]   Fax:  [phone]

## 2014-06-02 NOTE — Patient Instructions (Signed)
AROM: Wrist Extension   .  With _Rt___ palm down, bend wrist up. Repeat __15-20__ times per set.  Do __4-6__ sessions per day.    AROM: Wrist Flexion   With___Rt__ palm up, bend wrist up. Repeat __15-20__ times per set.  Do _4-6___ sessions per day.     Wrist Passive Flexion   Rest right forearm on table, hand palm-down over edge. Bend wrist by pressing hand down with other hand. Hold _10___ seconds. Repeat _5___ times. Do __4-6__ sessions per day.   Composite Extension (Passive Flexor Stretch)   Sitting with elbows on table and palms together, slowly lower wrists toward table until stretch is felt. Be sure to keep palms together throughout stretch. Hold __10__ seconds. Relax. Repeat _5___ times. Do _4-6___ sessions per day.   Pronation (Passive)   Keep elbow bent at right angle and held firmly to side. Use other hand to turn forearm until palm faces downward. Hold _10___ seconds. Repeat __5__ times. Do _4-6___ sessions per day.  Supination (Passive)   Keep elbow bent at right angle and held firmly at side. Use other hand to turn forearm until palm faces upward. Hold __10__ seconds. Repeat __5__ times. Do _4-6___ sessions per day.  Opposition (Active)   Touch tip of thumb to nail tip of each finger in turn, making an "O" shape. Repeat __10__ times. Do _4-6___ sessions per day.   MP Flexion (Active)   Bend thumb to touch base of little finger, keeping tip joint straight. Repeat __10-15__ times. Do _4-6___ sessions per day.       IP Flexion (Active Blocked)   Brace thumb below tip joint. Bend joint as far as possible. Repeat __10__ times. Do _4-6___ sessions per day.   Composite Extension (Active)   Bring thumb up and out in hitchhiker position.  Repeat __10-15__ times. Do _4-6___ sessions per day.   Composite Flexion (Passive)   Use other hand to bend both joints of thumb at the same time. Hold _10___ seconds. Repeat __5__ times. Do  _4-6___ sessions per day.

## 2014-06-16 ENCOUNTER — Ambulatory Visit: Payer: Medicaid Other | Admitting: Occupational Therapy

## 2014-06-16 ENCOUNTER — Encounter: Payer: Self-pay | Admitting: Occupational Therapy

## 2014-06-16 DIAGNOSIS — Z4789 Encounter for other orthopedic aftercare: Secondary | ICD-10-CM | POA: Diagnosis not present

## 2014-06-16 DIAGNOSIS — M25631 Stiffness of right wrist, not elsewhere classified: Secondary | ICD-10-CM

## 2014-06-16 NOTE — Therapy (Signed)
[place] [address] [place], [place], [address] Phone: [phone]   Fax:  [phone]  Occupational Therapy Treatment  Patient Details  Name: [name] MRN: [id number] Date of Birth: 1956/03/09 Referring Provider:  Milly Jakob, MD  Encounter Date: 06/16/2014      OT End of Session - 06/16/14 1356    Visit Number 3   Date for OT Re-Evaluation 08/15/14   Authorization Type MCD approved 3 visits    Authorization Time Period 05/20/14 - 08/17/14   Authorization - Visit Number 2   Authorization - Number of Visits 3   OT Start Time 1310   OT Stop Time 1355   OT Time Calculation (min) 45 min   Activity Tolerance Patient tolerated treatment well      Past Medical History  Diagnosis Date  . Hypertension   . Schizo-affective schizophrenia     Past Surgical History  Procedure Laterality Date  . Appendectomy    . Hernia repair    . Oophorectomy  unilateral  . Orif rt distal radius Right 05/15/14    There were no vitals filed for this visit.  Visit Diagnosis:  Stiffness of wrist joint, right      Subjective Assessment - 06/16/14 1314    Symptoms "I've been doing my exercises at home"   Pertinent History ORIF Rt distal radius 05/15/14   Repetition Increases Symptoms   Patient Stated Goals Be able to use my hand again   Currently in Pain? No/denies  today                    OT Treatments/Exercises (OP) - 06/16/14 1316    Wrist Exercises   Other wrist exercises A/ROM and P/ROM exercises in wrist flexion and extension and forearm supination and pronation. Pt tolerating self P/ROM ex's much better than last session   Hand Exercises   MCPJ Flexion AROM;10 reps  to thumb, followed by radial abduction x 10   DIPJ Flexion AROM;10 reps  to thumb (blocking MP joint in extension)   DIPJ Flexion Limitations 50* flexion w/ MP blocked  (Lt IP joint 80* flexion) at thumb   Thumb Opposition x 10  reps to small finger   Modalities   Modalities Fluidotherapy   RUE Fluidotherapy   Number Minutes Fluidotherapy 12 Minutes   RUE Fluidotherapy Location Hand;Wrist   Comments to decrease stiffness   Manual Therapy   Manual Therapy Other (comment)   Other Manual Therapy scar massage x 5 min. deep circles along scar incision. Pt instructed to do at home 2x/day for 5 minutes - pt agreed.                 OT Education - 06/16/14 1355    Education provided Yes   Education Details reviewed all A/ROM and P/ROM HEP, scar massage   Person(s) Educated Patient   Methods Explanation;Demonstration   Comprehension Verbalized understanding;Returned demonstration          OT Short Term Goals - 06/16/14 1319    OT SHORT TERM GOAL #1   Title Independent w/ splint wear and care (due 06/17/14)   Baseline Issued at eval but may need adjustments   Time 4   Period Weeks   Status Achieved   OT SHORT TERM GOAL #2   Title Independent w/ initial HEP   Baseline Issued, but may need review and updateds to include wrist AROM   Time 4   Period Weeks  Status Achieved           OT Long Term Goals - 05/19/14 1145    OT LONG TERM GOAL #1   Title Independent w/ updated strengthening HEP (due 08/15/14)   Baseline dependent d/t current precautions   Time 12   Period Weeks   Status New   OT LONG TERM GOAL #2   Title Pain less than or equal to 2/10 w/ all self care and functional task s   Baseline 3/10 at rest with pain meds   Time 12   Period Weeks   Status New   OT LONG TERM GOAL #3   Title Pt to return to using Rt hand as dominant hand for all self care tasks   Baseline dependent, using Lt non dominant hand   Time 12   Period Weeks   Status New   OT LONG TERM GOAL #4   Title Pt to demo 90% supination, pronation and wrist flexion and extension Rt hand    Baseline supination and pronation approx 60%, unable to assess wrist d/t current precautions   Time 12   Period Weeks   Status New    OT LONG TERM GOAL #5   Title Grip strenth RT hand to be 20 lbs or greater    Baseline dependent d/t current precautions   Time 12   Period Weeks   Status New               Plan - 06/16/14 1356    Clinical Impression Statement Pt with improved ROM at wrist and forearm as well as improved thumb motion. Pt tolerating P/ROM w/ only mild discomfort. Pt met all STG's.    Plan to see pt in 2 weeks to begin strengthening - issue HEP for grip and wrist strengthening, assess all LTG's and d/c (due to MCD visit limit)   Consulted and Agree with Plan of Care Patient        Problem List Patient Active Problem List   Diagnosis Date Noted  . Schizo-affective schizophrenia     Carey Bullocks, OTR/L 06/16/2014, 2:00 PM  [place] [address] [place], [place], [address] Phone: [phone]   Fax:  [phone]

## 2014-06-30 ENCOUNTER — Encounter: Payer: Self-pay | Admitting: Occupational Therapy

## 2014-06-30 ENCOUNTER — Ambulatory Visit: Payer: Medicaid Other | Attending: Orthopedic Surgery | Admitting: Occupational Therapy

## 2014-06-30 DIAGNOSIS — Z4789 Encounter for other orthopedic aftercare: Secondary | ICD-10-CM | POA: Insufficient documentation

## 2014-06-30 DIAGNOSIS — M25631 Stiffness of right wrist, not elsewhere classified: Secondary | ICD-10-CM

## 2014-06-30 DIAGNOSIS — R29898 Other symptoms and signs involving the musculoskeletal system: Secondary | ICD-10-CM

## 2014-06-30 DIAGNOSIS — M25531 Pain in right wrist: Secondary | ICD-10-CM | POA: Diagnosis not present

## 2014-06-30 NOTE — Patient Instructions (Signed)
1. Grip Strengthening (Resistive Putty)   Squeeze putty using thumb and all fingers. Repeat _20___ times. Do __2__ sessions per day.   2. Roll putty into tube on table and pinch between each finger and thumb x 15 reps each. (can do ring and small finger together)   3. Pinch: Lateral   Squeeze putty between right thumb and side of each finger in turn. Make small balls and squash into coin shapes. Repeat _15___ times. Do __2__ sessions per day.   Extension (Resistive)   With wrist over edge of table, lift __16__ ounces, keeping arm on table surface. Hold __3__ seconds. Lower slowly. Repeat _15___ times. Do __2__ sessions per day.   Flexion (Resistive)   With hand palm-up and holding __16__ ounces, bend hand toward you at wrist. Hold _3___ seconds. Relax slowly. Repeat _15___ times. Do _2___ sessions per day.   Pronation / Supination (Resistive)   Hold hammer weighing __1-2__ pounds and rotate palm up and down. Keep elbow flexed at side and wrist straight. Repeat _15___ times each way. Do __2__ sessions per day.  Copyright  VHI. All rights reserved.

## 2014-06-30 NOTE — Therapy (Signed)
[facility] [address] [place], [place], [address] Phone: [phone]   Fax:  [phone]  Occupational Therapy Treatment  Patient Details  Name: [name] MRN: [id number] Date of Birth: 07-31-1955 Referring Provider:  Milly Jakob, MD  Encounter Date: 06/30/2014      OT End of Session - 06/30/14 1429    Visit Number 4   Date for OT Re-Evaluation 08/15/14   Authorization Type MCD approved 3 visits    Authorization Time Period 05/20/14 - 08/17/14   Authorization - Visit Number 3   Authorization - Number of Visits 3   OT Start Time 0930   OT Stop Time 8315   OT Time Calculation (min) 45 min   Activity Tolerance Patient tolerated treatment well      Past Medical History  Diagnosis Date  . Hypertension   . Schizo-affective schizophrenia     Past Surgical History  Procedure Laterality Date  . Appendectomy    . Hernia repair    . Oophorectomy  unilateral  . Orif rt distal radius Right 05/15/14    There were no vitals filed for this visit.  Visit Diagnosis:  Stiffness of wrist joint, right  Weakness of right hand      Subjective Assessment - 06/30/14 0937    Subjective  "The exercises are really helping"   Pertinent History ORIF Rt distal radius 05/15/14   Currently in Pain? No/denies            [facility] OT Assessment - 06/30/14 1427    ROM / Strength   AROM / PROM / Strength AROM;Strength  Wrist flex = 55*, ext = 40*, sup = 85*, pron  = 80*   Strength   Overall Strength Comments Grip Rt = 5 lbs (Lt = 64 lbs)                OT Treatments/Exercises (OP) - 06/30/14 1761    ADLs   ADL Comments Assessed all LTG's and progress to date. Reviewed goals/progress with patient   Wrist Exercises   Other wrist exercises Light strengthening in wrist flexion and extension with 1 # weight x 15 reps each. Issued HEP   Other wrist exercises Forearm supination and pronation with light hammer x 15  reps each (Issued HEP)   Hand Exercises   Other Hand Exercises Pt performing putty exercises in mass grasp, tip pinch and thumb flexion x 15 reps each. Issued HEP with yellow putty                OT Education - 06/30/14 1005    Education provided Yes   Education Details putty HEP and wrist/forearm strengthening (light resistance)   Person(s) Educated Patient   Methods Explanation;Demonstration;Handout   Comprehension Verbalized understanding;Returned demonstration          OT Short Term Goals - 06/16/14 1319    OT SHORT TERM GOAL #1   Title Independent w/ splint wear and care (due 06/17/14)   Baseline Issued at eval but may need adjustments   Time 4   Period Weeks   Status Achieved   OT SHORT TERM GOAL #2   Title Independent w/ initial HEP   Baseline Issued, but may need review and updateds to include wrist AROM   Time 4   Period Weeks   Status Achieved           OT Long Term Goals - 06/30/14 1010    OT LONG TERM GOAL #1  Title Independent w/ updated strengthening HEP (due 08/15/14)   Baseline dependent d/t current precautions   Time 12   Period Weeks   Status Achieved   OT LONG TERM GOAL #2   Title Pain less than or equal to 2/10 w/ all self care and functional task s   Baseline 3/10 at rest with pain meds   Time 12   Period Weeks   Status Achieved  Pt only reports tingling sensation   OT LONG TERM GOAL #3   Title Pt to return to using Rt hand as dominant hand for all self care tasks   Baseline dependent, using Lt non dominant hand   Time 12   Period Weeks   Status Partially Met  for eating and dressing, still using Lt hand predominantly for grooming and bathing   OT LONG TERM GOAL #4   Title Pt to demo 90% supination, pronation and wrist flexion and extension Rt hand    Baseline supination and pronation approx 60%, unable to assess wrist d/t current precautions   Time 12   Period Weeks   Status Partially Met   Met for supination/pronation, not  wrist. Rt: wrist flex = 55*, ext = 40*, supination = 85*, pronation = 80*   OT LONG TERM GOAL #5   Title Grip strenth RT hand to be 20 lbs or greater    Baseline dependent d/t current precautions   Time 12   Period Weeks   Status Not Met  Rt = 5 lbs (Lt = 64 lbs)               Plan - 06/30/14 1430    Clinical Impression Statement Pt met 2 LTG's, partially met 2 LTG's. Pt did not meet LTG #5 secondary to grip strength limited. Pt issued HEP for light strengthening today. Pt instructed to continue all HEP's at home. Pt agreed   Plan D/C O.T.  Pt to continue HEP's at home        Problem List Patient Active Problem List   Diagnosis Date Noted  . Schizo-affective schizophrenia    OCCUPATIONAL THERAPY DISCHARGE SUMMARY  Visits from Start of Care: 4  Current functional level related to goals / functional outcomes: SEE ABOVE   Remaining deficits: ROM Strength   Education / Equipment: Splint wear and care, edema management, scar massage, HEP's for A/ROM, P/ROM and strengthening  Plan: Patient agrees to discharge.  Patient goals were partially met. Patient is being discharged due to financial reasons.  Insurance limitations                    ?????      Carey Bullocks, OTR/L 06/30/2014, 2:32 PM  [facility] [address] [place], [place], [address] Phone: [phone]   Fax:  [phone]

## 2014-11-10 ENCOUNTER — Encounter (HOSPITAL_COMMUNITY): Payer: Self-pay

## 2014-11-10 ENCOUNTER — Emergency Department (HOSPITAL_COMMUNITY)
Admission: EM | Admit: 2014-11-10 | Discharge: 2014-11-10 | Disposition: A | Discharge status: Home / Self Care | Payer: Medicaid Other | Attending: Emergency Medicine | Admitting: Emergency Medicine

## 2014-11-10 DIAGNOSIS — Z720 Tobacco use: Secondary | ICD-10-CM | POA: Insufficient documentation

## 2014-11-10 DIAGNOSIS — Z7982 Long term (current) use of aspirin: Secondary | ICD-10-CM | POA: Diagnosis not present

## 2014-11-10 DIAGNOSIS — R42 Dizziness and giddiness: Secondary | ICD-10-CM | POA: Diagnosis present

## 2014-11-10 DIAGNOSIS — I10 Essential (primary) hypertension: Secondary | ICD-10-CM | POA: Insufficient documentation

## 2014-11-10 DIAGNOSIS — Z79899 Other long term (current) drug therapy: Secondary | ICD-10-CM | POA: Insufficient documentation

## 2014-11-10 MED ORDER — MECLIZINE HCL 25 MG PO TABS
25.0000 mg | ORAL_TABLET | Freq: Once | ORAL | Status: AC
  Administered 2014-11-10: 25 mg via ORAL
  Filled 2014-11-10: qty 1

## 2014-11-10 MED ORDER — MECLIZINE HCL 50 MG PO TABS: 25.0000 mg | ORAL_TABLET | Freq: Three times a day (TID) | ORAL | Status: AC | PRN

## 2014-11-10 NOTE — ED Notes (Signed)
PA at the bedside.

## 2014-11-10 NOTE — ED Provider Notes (Signed)
CSN: [id number]     Arrival date & time 11/10/14  1240 History   First MD Initiated Contact with Patient 11/10/14 1250     Chief Complaint  Patient presents with  . Dizziness   HPI   59 year old female presents today with dizziness. Patient reports she woke up this morning and had symptoms which she describes as "dizziness, as if everything was spinning" stating she needed to hold on to something so that she did not fall down. Patient has had these symptoms previously and was diagnosed with vertigo and treated with meclizine which dramatically improved her symptoms. She reports she has not had any dizziness recently. Patient notes that symptoms were improved with sitting down and closing her eyes, worsened by side-to-side head movements. Patient reports that this caused significant anxiety, at that time she felt heart palpitations and tingling in her fingers bilateral. Dizziness symptoms. The other symptoms. As well. She reports trying to open your eyes again this cause significant dizziness, but did not have associated chest fluttering or numbness and tingling in the fingers. Patient reports she called 911 for transport to the hospital after she was unable to get on the bus to transport herself. Patient reports he symptoms are identical to previous presentations. Patient denies any focal neurological deficits including changes and small taste vision, speech, loss of sensation, strength or function of any of her strep these. Patient denies headache, reports some nausea associated with the dizziness, denies vomiting, fever, head trauma, upper respiratory symptoms. At this time my initial evaluation patient reports symptoms have significantly improved, but still experiencing some "dizziness". No chest pain, shortness of breath, neurological complaints.  Past Medical History  Diagnosis Date  . Hypertension   . Schizo-affective schizophrenia    Past Surgical History  Procedure Laterality Date  .  Appendectomy    . Hernia repair    . Oophorectomy  unilateral  . Orif rt distal radius Right 05/15/14   Family History  Problem Relation Age of Onset  . Hypertension Mother   . Diabetes Mother   . Heart failure Mother    Social History  Substance Use Topics  . Smoking status: Current Every Day Smoker    Types: Cigarettes  . Smokeless tobacco: None  . Alcohol Use: No   OB History    No data available     Review of Systems  All other systems reviewed and are negative.   Allergies  Review of patient's allergies indicates no known allergies.  Home Medications   Prior to Admission medications   Medication Sig Start Date End Date Taking? Authorizing Provider  ARIPiprazole (ABILIFY) 30 MG tablet Take 30 mg by mouth daily.   Yes Historical Provider, MD  aspirin EC 81 MG tablet Take 81 mg by mouth daily.   Yes Historical Provider, MD  hydrochlorothiazide (HYDRODIURIL) 25 MG tablet Take 25 mg by mouth daily.   Yes Historical Provider, MD  ibuprofen (ADVIL,MOTRIN) 200 MG tablet Take 400 mg by mouth every 8 (eight) hours as needed for pain.   Yes Historical Provider, MD  meclizine (ANTIVERT) 50 MG tablet Take 0.5 tablets (25 mg total) by mouth 3 (three) times daily as needed. 11/10/14   Jhovani Griswold, PA-C   BP 123/69 mmHg  Pulse 49  Temp(Src) 98.6 F (37 C) (Oral)  Resp 17  SpO2 99%   Physical Exam  Constitutional: She is oriented to person, place, and time. She appears well-developed and well-nourished.  HENT:  Head: Normocephalic and atraumatic.  Eyes:  Conjunctivae and EOM are normal. Pupils are equal, round, and reactive to light. Right eye exhibits no discharge. Left eye exhibits no discharge. No scleral icterus. Right eye exhibits normal extraocular motion and no nystagmus. Left eye exhibits normal extraocular motion and no nystagmus.  Neck: Normal range of motion. No JVD present. No tracheal deviation present.  Cardiovascular: Regular rhythm, normal heart sounds and  intact distal pulses.  Exam reveals no gallop and no friction rub.   No murmur heard. Pulmonary/Chest: Effort normal and breath sounds normal. No stridor. No respiratory distress. She has no wheezes. She has no rales. She exhibits no tenderness.  Neurological: She is alert and oriented to person, place, and time. She has normal strength. No cranial nerve deficit or sensory deficit. She displays a negative Romberg sign. Coordination normal. GCS eye subscore is 4. GCS verbal subscore is 5. GCS motor subscore is 6.  Skin: Skin is warm and dry.  Psychiatric: She has a normal mood and affect. Her behavior is normal. Judgment and thought content normal.  Nursing note and vitals reviewed.     ED Course  Procedures (including critical care time) Labs Review Labs Reviewed - No data to display  Imaging Review No results found. I have personally reviewed and evaluated these images and lab results as part of my medical decision-making.   EKG Interpretation   Date/Time:  Tuesday November 10 2014 12:54:40 EDT Ventricular Rate:  59 PR Interval:  193 QRS Duration: 109 QT Interval:  485 QTC Calculation: 480 R Axis:   -2 Text Interpretation:  Sinus rhythm Borderline T wave abnormalities ED  PHYSICIAN INTERPRETATION AVAILABLE IN [facility] Confirmed by TEST,  Record ([id number]) on 11/11/2014 7:25:38 AM      MDM   Final diagnoses:  Vertigo    Labs:  Imaging: EKG  Consults:  Therapeutics: Antivert  Discharge Meds:   Assessment/Plan: Patient presents with dizziness, identical to previous vertigo. Patient had no findings on neurological exam but appeared dizzy. I gave her 50 mg of meclizine here in the ED with near complete symptom resolution. No findings on exam today to indicate acute stroke. Patient will be given prescription for medication, encouraged follow-up with her primary care provider tomorrow for further evaluation and management. Patient was able to ambulate without difficulty,  was stable on her feet.         Eyvonne Mechanic, PA-C 11/11/14 1258  Melene Plan, DO 11/11/14 [phone]

## 2014-11-10 NOTE — Discharge Instructions (Signed)
Benign Positional Vertigo Vertigo means you feel like you or your surroundings are moving when they are not. Benign positional vertigo is the most common form of vertigo. Benign means that the cause of your condition is not serious. Benign positional vertigo is more common in older adults. CAUSES  Benign positional vertigo is the result of an upset in the labyrinth system. This is an area in the middle ear that helps control your balance. This may be caused by a viral infection, head injury, or repetitive motion. However, often no specific cause is found. SYMPTOMS  Symptoms of benign positional vertigo occur when you move your head or eyes in different directions. Some of the symptoms may include:  Loss of balance and falls.  Vomiting.  Blurred vision.  Dizziness.  Nausea.  Involuntary eye movements (nystagmus). DIAGNOSIS  Benign positional vertigo is usually diagnosed by physical exam. If the specific cause of your benign positional vertigo is unknown, your caregiver may perform imaging tests, such as magnetic resonance imaging (MRI) or computed tomography (CT). TREATMENT  Your caregiver may recommend movements or procedures to correct the benign positional vertigo. Medicines such as meclizine, benzodiazepines, and medicines for nausea may be used to treat your symptoms. In rare cases, if your symptoms are caused by certain conditions that affect the inner ear, you may need surgery. HOME CARE INSTRUCTIONS   Follow your caregiver's instructions.  Move slowly. Do not make sudden body or head movements.  Avoid driving.  Avoid operating heavy machinery.  Avoid performing any tasks that would be dangerous to you or others during a vertigo episode.  Drink enough fluids to keep your urine clear or pale yellow. SEEK IMMEDIATE MEDICAL CARE IF:   You develop problems with walking, weakness, numbness, or using your arms, hands, or legs.  You have difficulty speaking.  You develop  severe headaches.  Your nausea or vomiting continues or gets worse.  You develop visual changes.  Your family or friends notice any behavioral changes.  Your condition gets worse.  You have a fever.  You develop a stiff neck or sensitivity to light. MAKE SURE YOU:   Understand these instructions.  Will watch your condition.  Will get help right away if you are not doing well or get worse. Document Released: 12/19/2005 Document Revised: 06/05/2011 Document Reviewed: 12/01/2010 [facility] Patient Information 2015 [place], [place]. This information is not intended to replace advice given to you by your health care provider. Make sure you discuss any questions you have with your health care provider.  Please use medication as directed, please monitor for new or worsening signs or symptoms, return immediately if any present. Please contact her primary care provider inform them of your visit today and all relevant data. Please schedule follow-up appointment immediately for further evaluation and management.

## 2014-11-10 NOTE — ED Notes (Signed)
Per Patient, Patient woke up this morning and felt nauseous. Patient reports last night having chest fluttering and pins and needles in the left hand. Patient called EMS when she couldn't make it to the bus stop because she was dizzy and nausea. Complains of fluttering in chest, nausea, and dizziness during episode. Patient reports never having this before now. Patient reports Hx of Vertigo. Upon arrival, patient denies chest fluttering, nausea, and numbness. Patient reports dizziness. Vitals per EMS: CBG 114, 122/82, EKG unremarkable, 60 HR.

## 2016-05-22 ENCOUNTER — Encounter (HOSPITAL_COMMUNITY): Payer: Self-pay | Admitting: Emergency Medicine

## 2016-05-22 ENCOUNTER — Emergency Department (HOSPITAL_COMMUNITY)
Admission: EM | Admit: 2016-05-22 | Discharge: 2016-05-22 | Disposition: A | Discharge status: Home / Self Care | Payer: Medicaid Other | Attending: Emergency Medicine | Admitting: Emergency Medicine

## 2016-05-22 DIAGNOSIS — F17210 Nicotine dependence, cigarettes, uncomplicated: Secondary | ICD-10-CM | POA: Diagnosis not present

## 2016-05-22 DIAGNOSIS — Z7982 Long term (current) use of aspirin: Secondary | ICD-10-CM | POA: Insufficient documentation

## 2016-05-22 DIAGNOSIS — I10 Essential (primary) hypertension: Secondary | ICD-10-CM | POA: Diagnosis not present

## 2016-05-22 DIAGNOSIS — H04551 Acquired stenosis of right nasolacrimal duct: Secondary | ICD-10-CM

## 2016-05-22 DIAGNOSIS — H578 Other specified disorders of eye and adnexa: Secondary | ICD-10-CM | POA: Diagnosis present

## 2016-05-22 DIAGNOSIS — H049 Disorder of lacrimal system, unspecified: Secondary | ICD-10-CM | POA: Insufficient documentation

## 2016-05-22 MED ORDER — ERYTHROMYCIN 5 MG/GM OP OINT
1.0000 | TOPICAL_OINTMENT | Freq: Four times a day (QID) | OPHTHALMIC | 1 refills | Status: AC
Start: 2016-05-22 — End: 2016-05-27

## 2016-05-22 NOTE — ED Triage Notes (Signed)
Pt reports R eye irritation since yesterday which causes tearing.  Pt has also had R neck pain worse with raising R arm, and lower back pain. No known injury.

## 2016-05-22 NOTE — Discharge Instructions (Signed)
Apply your antibiotic eyedrops to your right eye 4 times daily for the next 5 days. I recommend continuing to apply warm compresses and massage in your right tear duct in a downward motion 4-5 times daily for the new 3-4 days to try to help "unclog" your tear duct.  Your symptoms have not improved over the next 2 days and recommend following up with the ophthalmology clinic listed below. Please return to the Emergency Department if symptoms worsen or new onset of fever, facial swelling/redness, loss of vision, worsening drainage, difficulty/unable to move your eyes.

## 2016-05-22 NOTE — ED Provider Notes (Signed)
[facility]-EMERGENCY DEPT Provider Note   CSN: [id number] Arrival date & time: 05/22/16  1524  By signing my name below, I, Cynda Acres, attest that this documentation has been prepared under the direction and in the presence of Melburn Hake, PA-C Electronically Signed: Cynda Acres, Scribe. 05/22/16. 4:17 PM.  History   Chief Complaint Chief Complaint  Patient presents with  . eye irritation  . Back Pain  . Shoulder Pain    HPI Comments: [name] is a 61 y.o. female with a hx of HTN and schizophrenia, who presents to the Emergency Department complaining of sudden-onset, constant right eye irritation that began yesterday evening. Patient reports associated right eye watery tearing, blurred vision due to constant tearing. Patient states " it just hurts" around the corner of my face near my eye. Patient reports using hot compresses intermittent with no improvement. Patient wears glasses, no contacts. Patient denies any loss of vision, eye redness, photophobia, recent injury, or similar problems.  Patient is also complaining of right shoulder pain that began yesterday. Patient states she may have laid on it, the pain has improved throughout the day. Denies any recent fall, trauma or injury. She reports the pain was initially worse with movement but states it has significantly improved. Denies taking any medications for symptoms. Denies swelling, redness, bruising, numbness or weakness.    The history is provided by the patient. No language interpreter was used.    Past Medical History:  Diagnosis Date  . Hypertension   . Schizo-affective schizophrenia [facility])     Patient Active Problem List   Diagnosis Date Noted  . Schizo-affective schizophrenia [facility])     Past Surgical History:  Procedure Laterality Date  . APPENDECTOMY    . HERNIA REPAIR    . OOPHORECTOMY  unilateral  . ORIF Rt distal radius Right 05/15/14    OB History    No data available       Home Medications     Prior to Admission medications   Medication Sig Start Date End Date Taking? Authorizing Provider  ARIPiprazole (ABILIFY) 30 MG tablet Take 30 mg by mouth daily.    Historical Provider, MD  aspirin EC 81 MG tablet Take 81 mg by mouth daily.    Historical Provider, MD  erythromycin ophthalmic ointment Place 1 application into the right eye 4 (four) times daily. Place 1/2 inch ribbon of ointment in the affected eye 4 times a day 05/22/16 05/27/16  Barrett Henle, PA-C  hydrochlorothiazide (HYDRODIURIL) 25 MG tablet Take 25 mg by mouth daily.    Historical Provider, MD  ibuprofen (ADVIL,MOTRIN) 200 MG tablet Take 400 mg by mouth every 8 (eight) hours as needed for pain.    Historical Provider, MD  meclizine (ANTIVERT) 50 MG tablet Take 0.5 tablets (25 mg total) by mouth 3 (three) times daily as needed. 11/10/14   Eyvonne Mechanic, PA-C    Family History Family History  Problem Relation Age of Onset  . Hypertension Mother   . Diabetes Mother   . Heart failure Mother     Social History Social History  Substance Use Topics  . Smoking status: Current Every Day Smoker    Types: Cigarettes  . Smokeless tobacco: Not on file  . Alcohol use No     Allergies   Patient has no known allergies.   Review of Systems Review of Systems  HENT: Negative for facial swelling.   Eyes: Positive for pain, discharge, itching and visual disturbance. Negative for photophobia and redness.  Musculoskeletal: Positive for arthralgias (right shoulder). Negative for joint swelling.  Skin: Negative for wound.  Neurological: Negative for weakness and numbness.     Physical Exam Updated Vital Signs BP 118/78 (BP Location: Left Arm)   Pulse 68   Temp 98.2 F (36.8 C) (Oral)   Resp 16   Ht 5\' 4"  (1.626 m)   Wt 113.4 kg   SpO2 97%   BMI 42.91 kg/m   Physical Exam  Constitutional: She is oriented to person, place, and time. She appears well-developed and well-nourished.  HENT:  Head:  Normocephalic and atraumatic.    Nose: Nose normal. No rhinorrhea. Right sinus exhibits no maxillary sinus tenderness and no frontal sinus tenderness. Left sinus exhibits no maxillary sinus tenderness and no frontal sinus tenderness.  Mouth/Throat: Uvula is midline, oropharynx is clear and moist and mucous membranes are normal. No oropharyngeal exudate, posterior oropharyngeal edema, posterior oropharyngeal erythema or tonsillar abscesses. No tonsillar exudate.  Mild TTP over right nasolacrimal gland with small amount of swelling present. No erythema, warmth, induration or fluctuance present. Pt continuously tearing throughout exam from right eye. Otherwise no facial/neck swelling.   Eyes: Conjunctivae, EOM and lids are normal. Pupils are equal, round, and reactive to light. Lids are everted and swept, no foreign bodies found. Right eye exhibits discharge (watery). Right eye exhibits no chemosis, no exudate and no hordeolum. No foreign body present in the right eye. Left eye exhibits no chemosis, no discharge, no exudate and no hordeolum. No foreign body present in the left eye. Right conjunctiva is not injected. Right conjunctiva has no hemorrhage. Left conjunctiva is not injected. Left conjunctiva has no hemorrhage. No scleral icterus.    Neck: Normal range of motion. Neck supple.  Cardiovascular: Normal rate.   Pulmonary/Chest: Effort normal.  Musculoskeletal: Normal range of motion. She exhibits no edema, tenderness or deformity.       Right shoulder: Normal. She exhibits normal range of motion, no tenderness, no bony tenderness, no swelling, no effusion, no crepitus, no deformity, no laceration, no pain, no spasm, normal pulse and normal strength.       Right elbow: Normal.      Right wrist: Normal.       Right upper arm: Normal.       Right forearm: Normal.       Right hand: Normal.  Neurological: She is alert and oriented to person, place, and time.  Skin: Skin is warm and dry.  Nursing  note and vitals reviewed.    ED Treatments / Results  DIAGNOSTIC STUDIES: Oxygen Saturation is 97% on RA, normal by my interpretation.    COORDINATION OF CARE: 4:12 PM Discussed treatment plan with pt at bedside and pt agreed to plan, which includes warm compresses.   Labs (all labs ordered are listed, but only abnormal results are displayed) Labs Reviewed - No data to display  EKG  EKG Interpretation None       Radiology No results found.  Procedures Procedures (including critical care time)  Medications Ordered in ED Medications - No data to display   Initial Impression / Assessment and Plan / ED Course  I have reviewed the triage vital signs and the nursing notes.  Pertinent labs & imaging results that were available during my care of the patient were reviewed by me and considered in my medical decision making (see chart for details).     Patient presents with tenderness to corner of right orbital region with associated tearing that started  yesterday. Denies use of contacts. Reports associated blurred vision due to watering of her eye, otherwise denies changes in vision. Denies fever, redness, facial swelling, eye redness. Denies any recent injury to her face/eye. VSS. Exam revealed mild swelling and tenderness over right nasolacrimal gland with excessive tearing present to right eye. Right conjunctiva or other discharge. Remaining exam unremarkable. Suspect patient's symptoms are likely due to nasolacrimal duct obstruction. Plan to discharge patient home with erythromycin ophthalmic ointment and symptomatically treatment including continued warm compresses and massage. Patient given information to follow-up with ophthalmologist within the next 2 days. Discussed return precautions.  Patient also reports with right shoulder pain that occurred after sleeping on her shoulder last night. Reports pain is significantly improved and notes is not currently bothering her. Exam  unremarkable. Right upper extremity neurovascularly intact. Plan to discharge patient home with symptomatic treatment as needed.  Final Clinical Impressions(s) / ED Diagnoses   Final diagnoses:  Nasolacrimal duct obstruction, acquired, right    New Prescriptions New Prescriptions   ERYTHROMYCIN OPHTHALMIC OINTMENT    Place 1 application into the right eye 4 (four) times daily. Place 1/2 inch ribbon of ointment in the affected eye 4 times a day   I personally performed the services described in this documentation, which was scribed in my presence. The recorded information has been reviewed and is accurate.     Satira Sark [place], [place] 05/22/16 1634    Dione Booze, MD 05/22/16 2325

## 2017-01-14 IMAGING — CR DG FOREARM 2V*R*
2 series · 2 of 2 positions shown · non-contrast
Comparison: None.

CLINICAL DATA: Tripped, fell forward, landing on right hand and
arm. Apparent deformity and swelling at distal right forearm and
wrist. Initial encounter.

EXAM:
RIGHT FOREARM - 2 VIEW

[x forearm ap right]
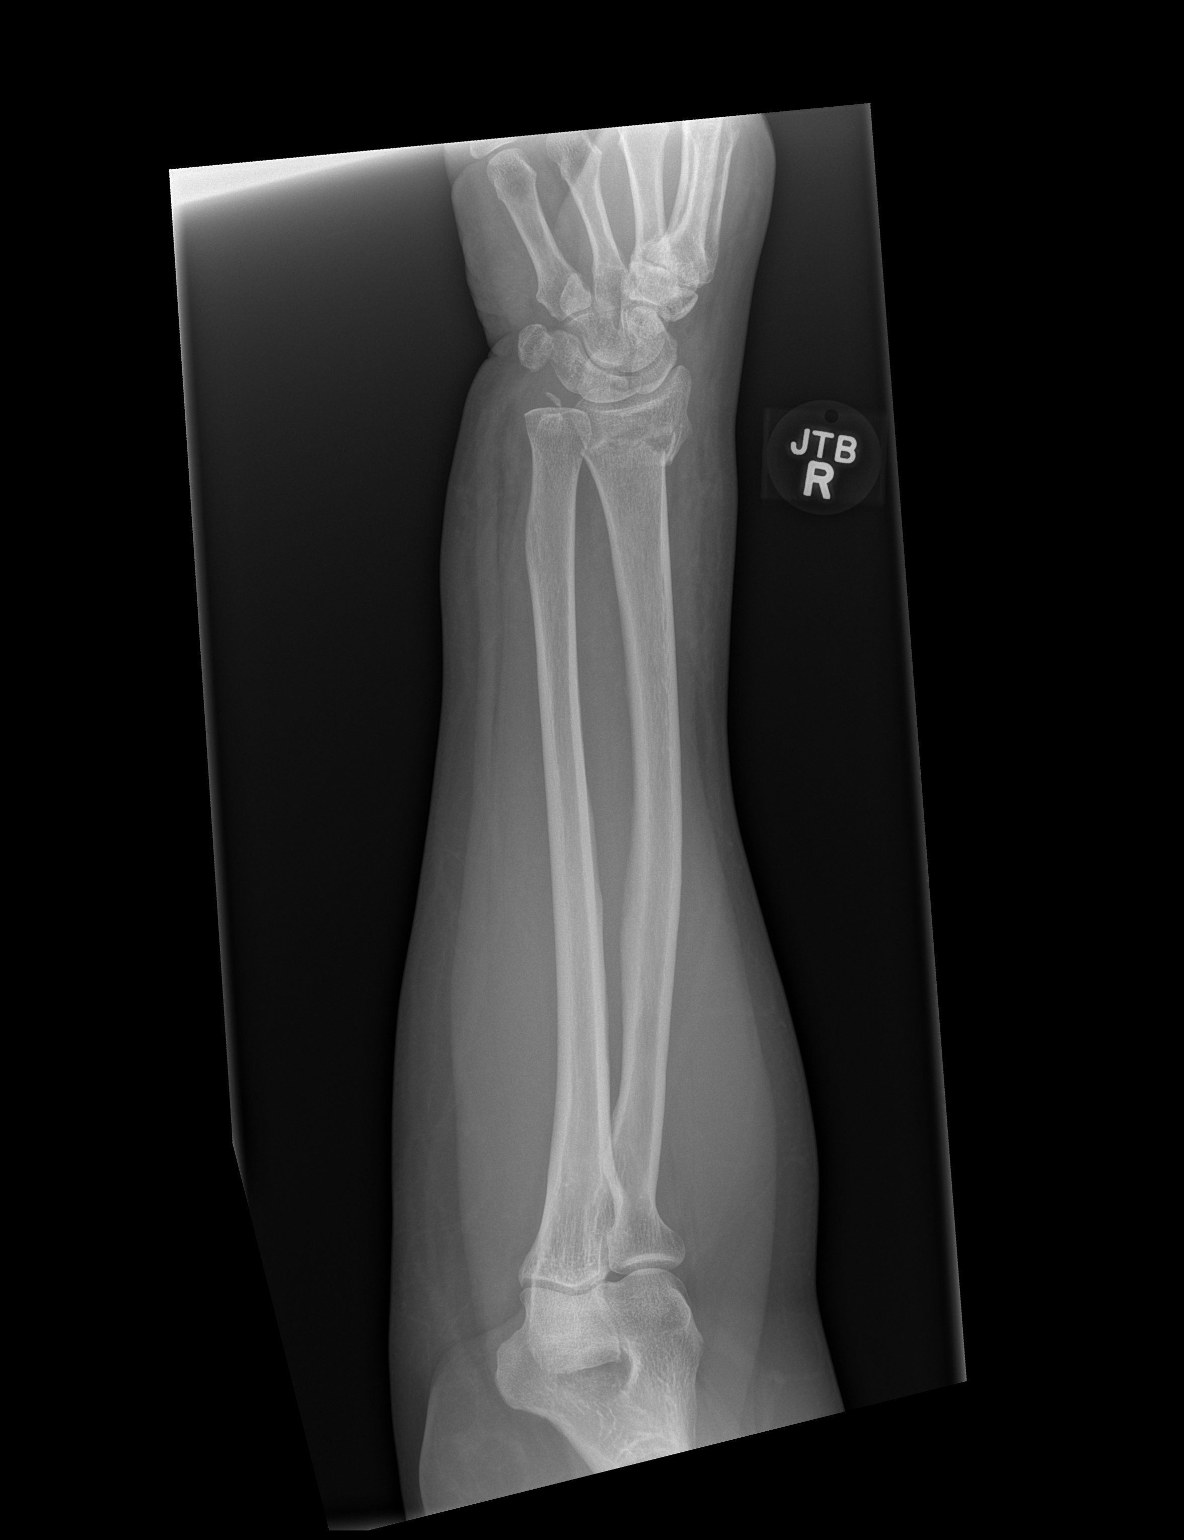

[x forearm lat right]
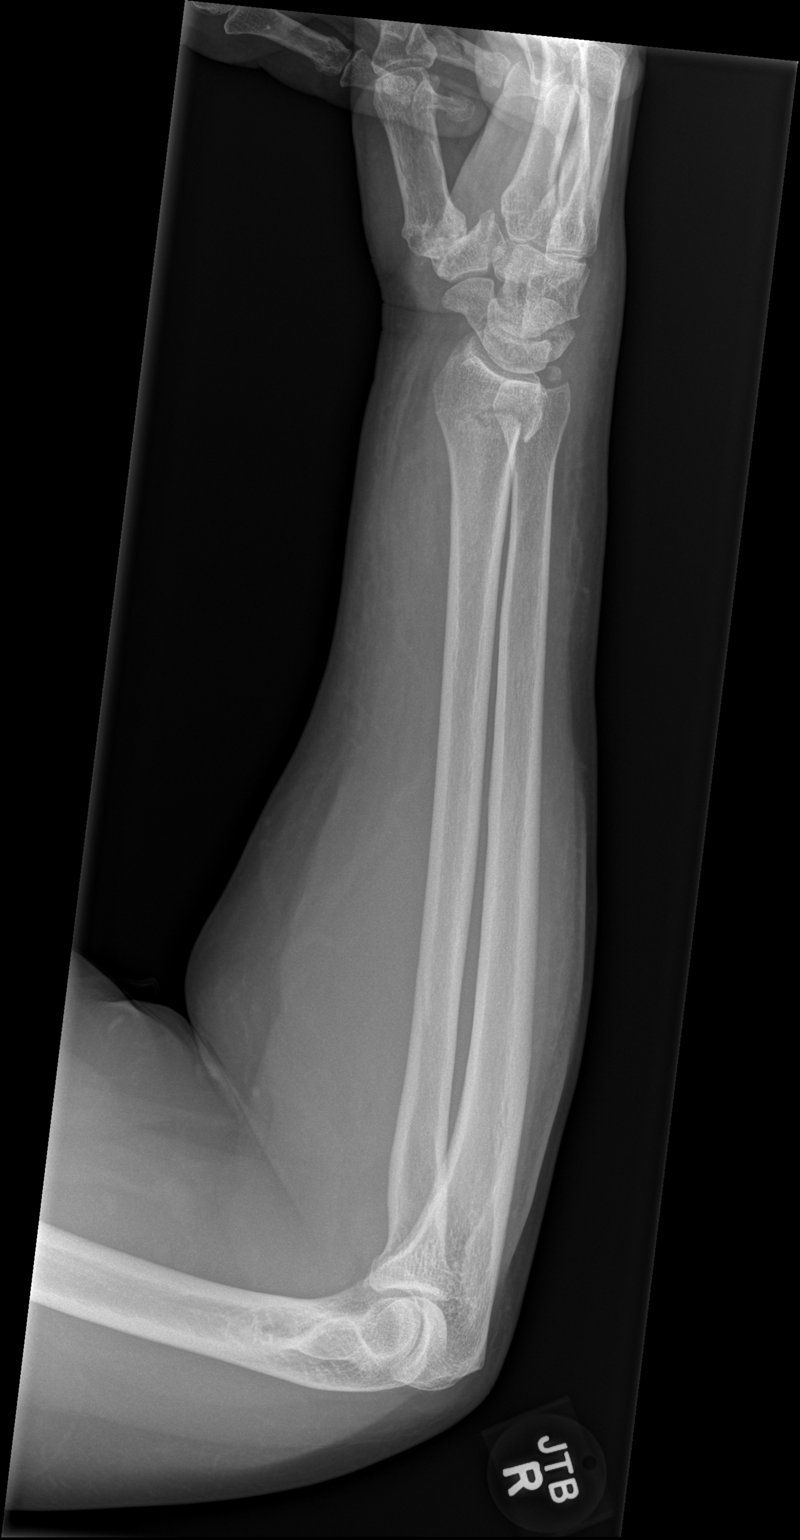

[2 of 2 positions shown; findings below may reference images not displayed]

FINDINGS: There is a mildly displaced distal right radial fracture with mild
comminution. Probable intra-articular extension. Ulnar styloid
fracture also noted. No additional acute bony abnormality.
IMPRESSION: Mildly displaced comminuted distal right radial fracture.

Ulnar styloid fracture.

## 2017-01-14 IMAGING — CR DG HAND COMPLETE 3+V*R*
3 series · 3 of 3 positions shown · non-contrast
Comparison: Right forearm series performed today.

CLINICAL DATA: Tripped and fell forward, landing on outstretched
hand. Deformity, swelling, pain. In search first

EXAM:
RIGHT HAND - COMPLETE 3+ VIEW

[x hand pa right]
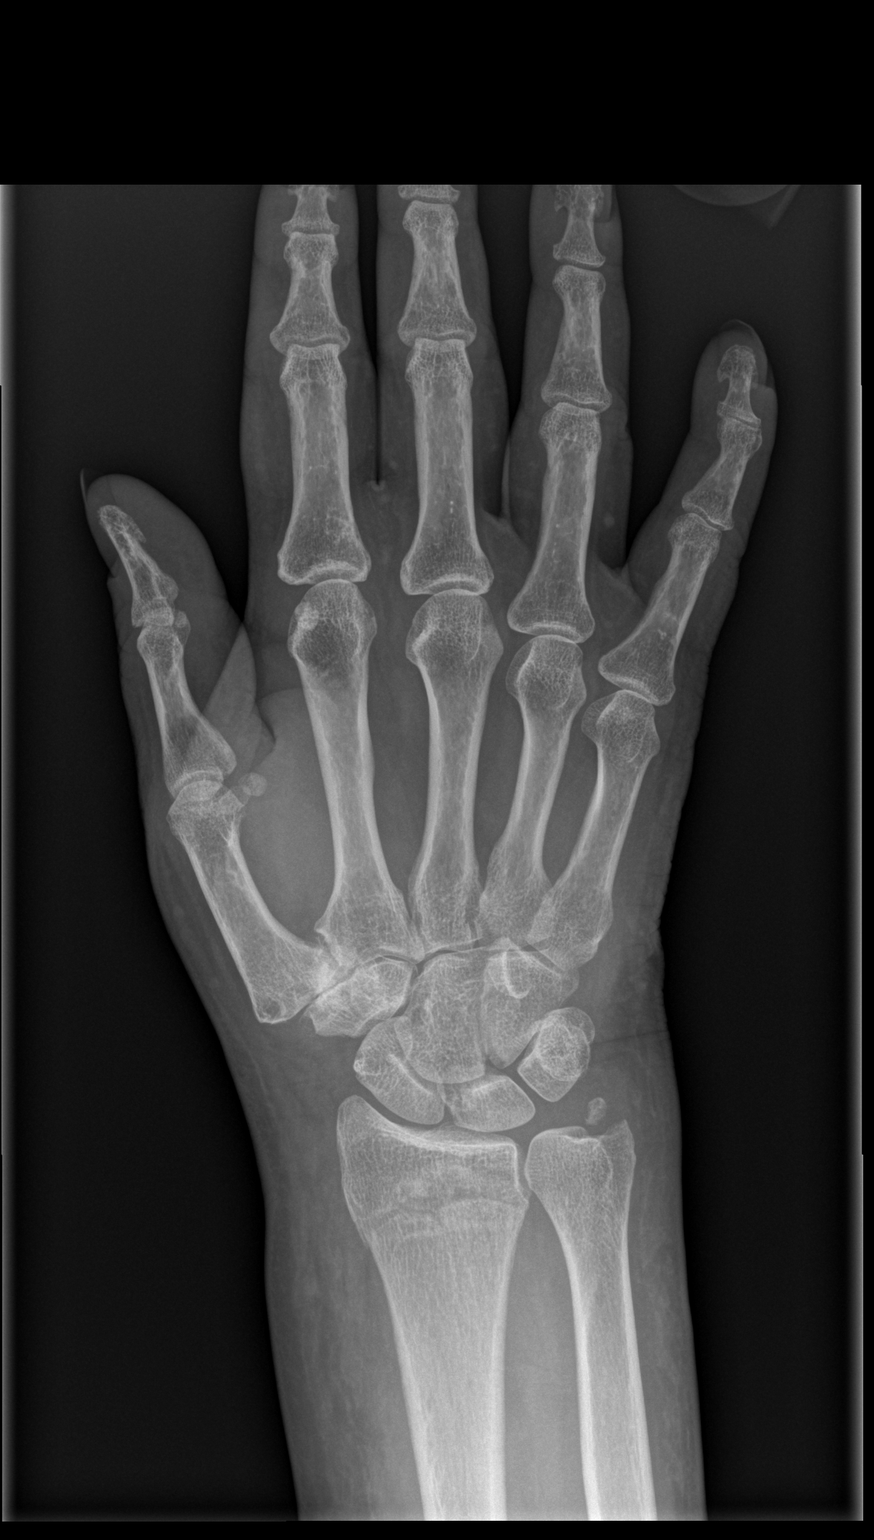

[x hand obl right]
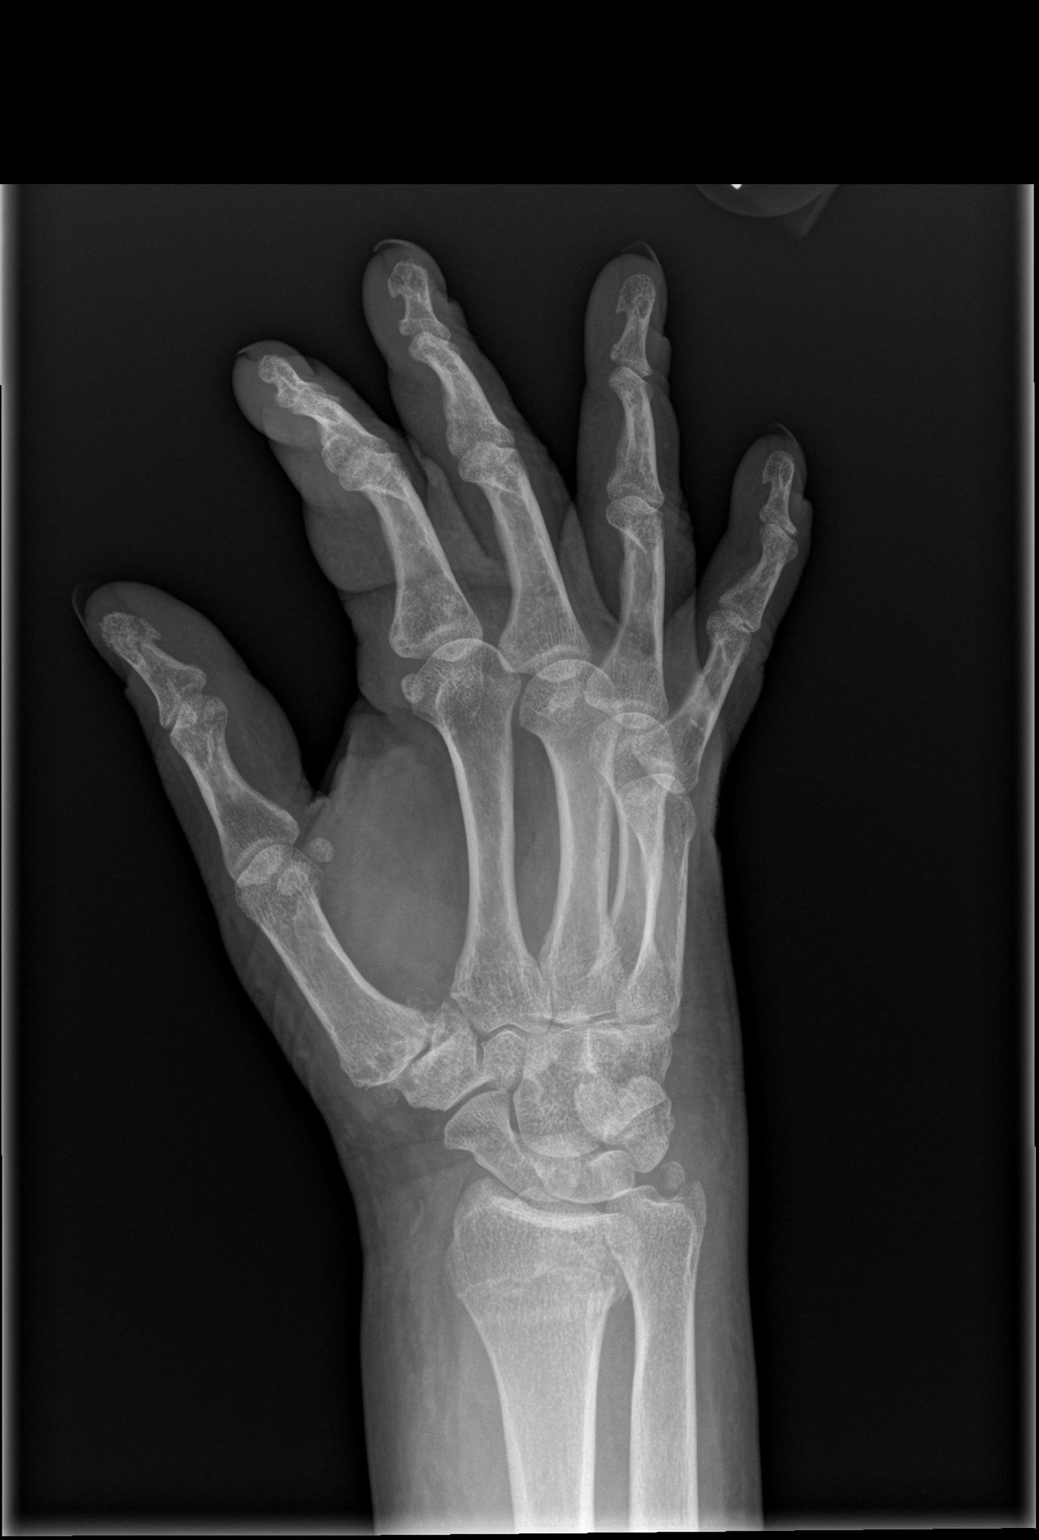

[x hand lat right]
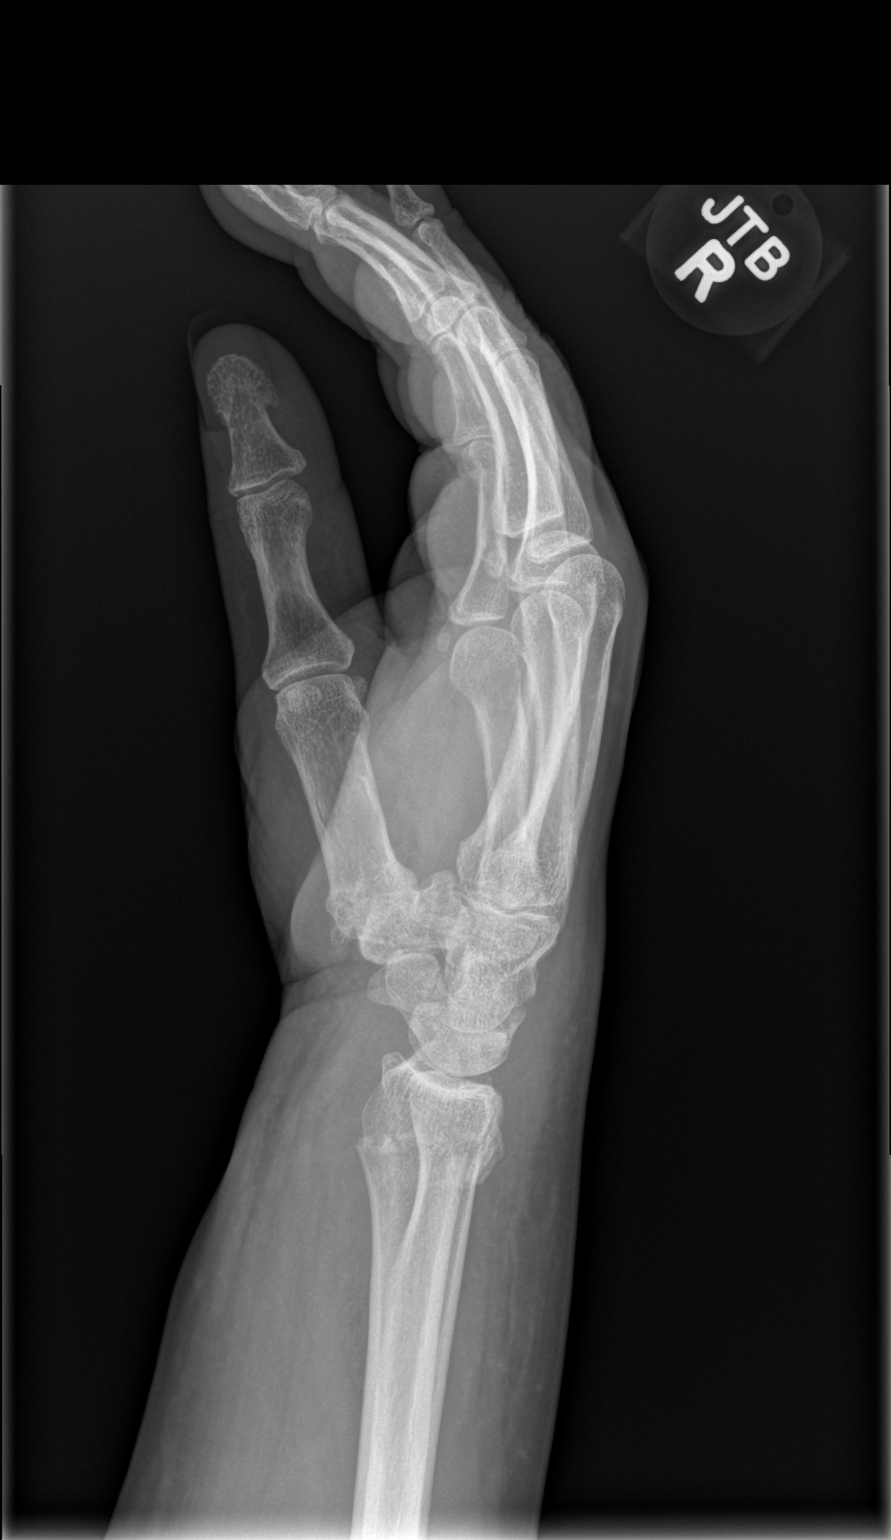

[3 of 3 positions shown; findings below may reference images not displayed]

FINDINGS: There is a distal right radial metaphyseal fracture with probable
intra-articular extension. Minimal displacement. Mildly displaced
ulnar styloid fracture. Overlying soft tissue swelling noted.

No additional acute bony abnormality.
IMPRESSION: Distal right radial fracture and ulnar styloid fracture.

## 2017-02-02 ENCOUNTER — Emergency Department (HOSPITAL_COMMUNITY): Payer: Medicaid Other

## 2017-02-02 ENCOUNTER — Emergency Department (HOSPITAL_COMMUNITY)
Admission: EM | Admit: 2017-02-02 | Discharge: 2017-02-02 | Disposition: A | Discharge status: Home / Self Care | Payer: Medicaid Other | Attending: Emergency Medicine | Admitting: Emergency Medicine

## 2017-02-02 ENCOUNTER — Other Ambulatory Visit: Payer: Self-pay

## 2017-02-02 ENCOUNTER — Encounter (HOSPITAL_COMMUNITY): Payer: Self-pay | Admitting: Emergency Medicine

## 2017-02-02 DIAGNOSIS — M19011 Primary osteoarthritis, right shoulder: Secondary | ICD-10-CM | POA: Insufficient documentation

## 2017-02-02 DIAGNOSIS — Z7982 Long term (current) use of aspirin: Secondary | ICD-10-CM | POA: Diagnosis not present

## 2017-02-02 DIAGNOSIS — I10 Essential (primary) hypertension: Secondary | ICD-10-CM | POA: Insufficient documentation

## 2017-02-02 DIAGNOSIS — F17210 Nicotine dependence, cigarettes, uncomplicated: Secondary | ICD-10-CM | POA: Insufficient documentation

## 2017-02-02 DIAGNOSIS — E119 Type 2 diabetes mellitus without complications: Secondary | ICD-10-CM | POA: Diagnosis not present

## 2017-02-02 DIAGNOSIS — M25511 Pain in right shoulder: Secondary | ICD-10-CM

## 2017-02-02 DIAGNOSIS — Z79899 Other long term (current) drug therapy: Secondary | ICD-10-CM | POA: Diagnosis not present

## 2017-02-02 DIAGNOSIS — M1990 Unspecified osteoarthritis, unspecified site: Secondary | ICD-10-CM

## 2017-02-02 HISTORY — DX: Type 2 diabetes mellitus without complications: E11.9

## 2017-02-02 MED ORDER — MELOXICAM 7.5 MG PO TABS
7.5000 mg | ORAL_TABLET | Freq: Every day | ORAL | 0 refills | Status: DC
Start: 2017-02-02 — End: 2017-02-02

## 2017-02-02 MED ORDER — KETOROLAC TROMETHAMINE 60 MG/2ML IM SOLN
60.0000 mg | Freq: Once | INTRAMUSCULAR | Status: AC
  Administered 2017-02-02: 60 mg via INTRAMUSCULAR
  Filled 2017-02-02: qty 2

## 2017-02-02 MED ORDER — CYCLOBENZAPRINE HCL 10 MG PO TABS: 10.0000 mg | ORAL_TABLET | Freq: Every evening | ORAL | 0 refills | Status: DC | PRN

## 2017-02-02 MED ORDER — MELOXICAM 7.5 MG PO TABS: 7.5000 mg | ORAL_TABLET | Freq: Every day | ORAL | 0 refills | Status: AC

## 2017-02-02 NOTE — ED Provider Notes (Signed)
[facility]-EMERGENCY DEPT Provider Note   CSN: [id number] Arrival date & time: 02/02/17  0831     History   Chief Complaint Chief Complaint  Patient presents with  . Shoulder Pain    HPI [name] is a 61 y.o. female.  Patient is a 61 year old female with a history of hypertension, diabetes and schizoaffective schizophrenia presenting today with right shoulder pain.  Patient states the shoulder pain has been present for the last 2 weeks and seems to be worse since it has gotten colder.  It is an 8 out of 10 and keeps her awake at night.  Occasionally the pain radiates into the right side of her neck.  She denies any numbness or weakness of the right arm.  No chest pain, shortness of breath.  She denies any injury to the arm.  She is right-handed.  She has been taking Aleve and Tylenol.  She states the Aleve helped but the Tylenol does not.  Denies fever or infectious symptoms.   The history is provided by the patient.    Past Medical History:  Diagnosis Date  . Diabetes mellitus without complication (HCC)   . Hypertension   . Schizo-affective schizophrenia [facility](HCC)     Patient Active Problem List   Diagnosis Date Noted  . Schizo-affective schizophrenia [facility](HCC)     Past Surgical History:  Procedure Laterality Date  . APPENDECTOMY    . HERNIA REPAIR    . OOPHORECTOMY  unilateral  . ORIF Rt distal radius Right 05/15/14    OB History    No data available       Home Medications    Prior to Admission medications   Medication Sig Start Date End Date Taking? Authorizing Provider  ARIPiprazole (ABILIFY) 30 MG tablet Take 30 mg by mouth daily.    [provider]  aspirin EC 81 MG tablet Take 81 mg by mouth daily.    [provider]  cyclobenzaprine (FLEXERIL) 10 MG tablet Take 1 tablet (10 mg total) at bedtime as needed by mouth for muscle spasms. 02/02/17   Gwyneth SproutPlunkett, Troy Hartzog, MD  hydrochlorothiazide (HYDRODIURIL) 25 MG tablet Take 25  mg by mouth daily.    [provider]  ibuprofen (ADVIL,MOTRIN) 200 MG tablet Take 400 mg by mouth every 8 (eight) hours as needed for pain.    [provider]  meclizine (ANTIVERT) 50 MG tablet Take 0.5 tablets (25 mg total) by mouth 3 (three) times daily as needed. 11/10/14   Hedges, Tinnie GensJeffrey, PA-C  meloxicam (MOBIC) 7.5 MG tablet Take 1 tablet (7.5 mg total) daily by mouth. 02/02/17   Gwyneth SproutPlunkett, Epifanio Labrador, MD    Family History Family History  Problem Relation Age of Onset  . Hypertension Mother   . Diabetes Mother   . Heart failure Mother     Social History Social History   Tobacco Use  . Smoking status: Current Every Day Smoker    Types: Cigarettes  Substance Use Topics  . Alcohol use: No  . Drug use: No     Allergies   Patient has no known allergies.   Review of Systems Review of Systems  All other systems reviewed and are negative.    Physical Exam Updated Vital Signs BP (!) 136/91 (BP Location: Left Arm)   Pulse 69   Temp 98 F (36.7 C) (Oral)   Resp 18   SpO2 99%   Physical Exam  Constitutional: She is oriented to person, place, and time. She appears well-developed  and well-nourished. No distress.  HENT:  Head: Normocephalic and atraumatic.  Eyes: EOM are normal. Pupils are equal, round, and reactive to light.  Cardiovascular: Normal rate.  Pulmonary/Chest: Effort normal.  Musculoskeletal:       Right shoulder: She exhibits decreased range of motion, tenderness, bony tenderness and pain. She exhibits no swelling, no effusion, no deformity, normal pulse and normal strength.       Arms: Neurological: She is alert and oriented to person, place, and time.  5 out of 5 hand grip strength bilaterally.  Sensation intact in bilateral upper extremities.  Skin: Skin is warm and dry. No erythema.  Psychiatric: She has a normal mood and affect. Her behavior is normal.  Nursing note and vitals reviewed.    ED Treatments / Results  Labs (all labs  ordered are listed, but only abnormal results are displayed) Labs Reviewed - No data to display  EKG  EKG Interpretation None       Radiology Dg Shoulder Right  Result Date: 02/02/2017 CLINICAL DATA:  Worsening right shoulder pain 2 weeks.  No injury. EXAM: RIGHT SHOULDER - 2+ VIEW COMPARISON:  None. FINDINGS: Examination demonstrates moderate degenerative changes of the glenohumeral joint with mild degenerate change of the [facility]. No evidence of fracture or dislocation. IMPRESSION: No acute findings. 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 Electronically Signed   By: Elberta Fortisaniel  Boyle M.D.   On: 02/02/2017 08:55    Procedures Procedures (including critical care time)  Medications Ordered in ED Medications  ketorolac (TORADOL) injection 60 mg (not administered)     Initial Impression / Assessment and Plan / ED Course  I have reviewed the triage vital signs and the nursing notes.  Pertinent labs & imaging results that were available during my care of the patient were reviewed by me and considered in my medical decision making (see chart for details).     Patient here with nontraumatic right shoulder pain.  No evidence of septic arthritis at this time.  X-ray shows moderate degenerative changes but no other acute findings.  Patient denies any trauma.  She is right-handed.  Discussed results with the patient.  She was given meloxicam and Flexeril.  She was given follow-up with orthopedics.  Final Clinical Impressions(s) / ED Diagnoses   Final diagnoses:  Acute pain of right shoulder  Arthritis    ED Discharge Orders        Ordered    meloxicam (MOBIC) 7.5 MG tablet  Daily     02/02/17 1015    cyclobenzaprine (FLEXERIL) 10 MG tablet  At bedtime PRN     02/02/17 1015       Gwyneth SproutPlunkett, Valynn Schamberger, MD 02/02/17 1036

## 2017-02-02 NOTE — ED Notes (Signed)
Bed: WTR6 Expected date:  Expected time:  Means of arrival:  Comments: 

## 2017-02-02 NOTE — ED Triage Notes (Signed)
Per EMS pt complaint of continued right shoulder pain for 2 weeks worse with movement and touch; denies injury.

## 2017-02-07 ENCOUNTER — Emergency Department (HOSPITAL_COMMUNITY)
Admission: EM | Admit: 2017-02-07 | Discharge: 2017-02-07 | Disposition: A | Discharge status: Home / Self Care | Payer: Medicaid Other | Attending: Emergency Medicine | Admitting: Emergency Medicine

## 2017-02-07 ENCOUNTER — Encounter (HOSPITAL_COMMUNITY): Payer: Self-pay | Admitting: Emergency Medicine

## 2017-02-07 DIAGNOSIS — G8929 Other chronic pain: Secondary | ICD-10-CM

## 2017-02-07 DIAGNOSIS — M25511 Pain in right shoulder: Secondary | ICD-10-CM | POA: Insufficient documentation

## 2017-02-07 DIAGNOSIS — F17210 Nicotine dependence, cigarettes, uncomplicated: Secondary | ICD-10-CM | POA: Diagnosis not present

## 2017-02-07 DIAGNOSIS — Z7982 Long term (current) use of aspirin: Secondary | ICD-10-CM | POA: Diagnosis not present

## 2017-02-07 DIAGNOSIS — Z79899 Other long term (current) drug therapy: Secondary | ICD-10-CM | POA: Insufficient documentation

## 2017-02-07 DIAGNOSIS — E119 Type 2 diabetes mellitus without complications: Secondary | ICD-10-CM | POA: Diagnosis not present

## 2017-02-07 DIAGNOSIS — I10 Essential (primary) hypertension: Secondary | ICD-10-CM | POA: Diagnosis not present

## 2017-02-07 MED ORDER — DICLOFENAC SODIUM 1 % TD GEL: 2.0000 g | Freq: Four times a day (QID) | TRANSDERMAL | 0 refills | Status: DC

## 2017-02-07 MED ORDER — OXYCODONE-ACETAMINOPHEN 5-325 MG PO TABS
1.0000 | ORAL_TABLET | Freq: Once | ORAL | Status: AC
  Administered 2017-02-07: 1 via ORAL
  Filled 2017-02-07: qty 1

## 2017-02-07 MED ORDER — LIDOCAINE 5 % EX PTCH
1.0000 | MEDICATED_PATCH | CUTANEOUS | Status: DC
  Administered 2017-02-07: 1 via TRANSDERMAL
  Filled 2017-02-07: qty 1

## 2017-02-07 NOTE — ED Triage Notes (Signed)
Pt c/o ongoing R shoulder pain. Pt seen 5 days ago and given meds for pain, but patient states it isn't helping and would like to stop the Rx meds and continue Aleve again. Pt denies injury. States she has attempted heat and icy hot as well.

## 2017-02-07 NOTE — Discharge Instructions (Signed)
As discussed, you can apply the gel to your shoulder for pain and heat to help with the muscle spasm up to your neck.  If you are not going to take your meloxicam as prescribed, you can take alieve instead for pain with your muscle relaxer at night time.  Follow up with Dr. Shon BatonBrooks at your upcoming appointment.

## 2017-02-07 NOTE — ED Provider Notes (Signed)
[facility]-EMERG[facility] DEPT Provider Note   CSN: [id number] Arrival date & time: 02/07/17  1344     History   Chief Complaint Chief Complaint  Patient presents with  . Shoulder Pain    HPI [name] is a 61 y.o. female with past medical history of diabetes, hypertension and schizoaffective schizophrenia presenting with chronic right shoulder pain that has not improved with meloxicam and muscle relaxer over the last 4 days. She has been experiencing this pain for over 3 weeks now and denies any injury or trauma. She denies any change in the pain or symptoms since the last visit. No swelling, erythema, warmth, fever, chills, numbness. She states that she wants something else for the pain. She points to the right trapezius muscle distribution from her shoulder up to her neck as the area that is worse. Pain is exacerbated by motion. She reports no alleviating factors.   HPI  Past Medical History:  Diagnosis Date  . Diabetes mellitus without complication (HCC)   . Hypertension   . Schizo-affective schizophrenia [facility](HCC)     Patient Active Problem List   Diagnosis Date Noted  . Schizo-affective schizophrenia [facility](HCC)     Past Surgical History:  Procedure Laterality Date  . APPENDECTOMY    . HERNIA REPAIR    . OOPHORECTOMY  unilateral  . ORIF Rt distal radius Right 05/15/14    OB History    No data available       Home Medications    Prior to Admission medications   Medication Sig Start Date End Date Taking? Authorizing Provider  ARIPiprazole (ABILIFY) 30 MG tablet Take 30 mg by mouth daily.    [provider]  aspirin EC 81 MG tablet Take 81 mg by mouth daily.    [provider]  cyclobenzaprine (FLEXERIL) 10 MG tablet Take 1 tablet (10 mg total) at bedtime as needed by mouth for muscle spasms. 02/02/17   Gwyneth SproutPlunkett, Whitney, MD  diclofenac sodium (VOLTAREN) 1 % GEL Apply 2 g 4 (four) times daily topically. 02/07/17   Georgiana ShoreMitchell, Lavar Rosenzweig B,  PA-C  hydrochlorothiazide (HYDRODIURIL) 25 MG tablet Take 25 mg by mouth daily.    [provider]  ibuprofen (ADVIL,MOTRIN) 200 MG tablet Take 400 mg by mouth every 8 (eight) hours as needed for pain.    [provider]  meclizine (ANTIVERT) 50 MG tablet Take 0.5 tablets (25 mg total) by mouth 3 (three) times daily as needed. 11/10/14   Hedges, Tinnie GensJeffrey, PA-C  meloxicam (MOBIC) 7.5 MG tablet Take 1 tablet (7.5 mg total) daily by mouth. 02/02/17   Gwyneth SproutPlunkett, Whitney, MD    Family History Family History  Problem Relation Age of Onset  . Hypertension Mother   . Diabetes Mother   . Heart failure Mother     Social History Social History   Tobacco Use  . Smoking status: Current Every Day Smoker    Types: Cigarettes  . Smokeless tobacco: Never Used  Substance Use Topics  . Alcohol use: No  . Drug use: No     Allergies   Patient has no known allergies.   Review of Systems Review of Systems  Constitutional: Negative for chills, diaphoresis and fever.  Respiratory: Negative for shortness of breath, wheezing and stridor.   Cardiovascular: Negative for chest pain.  Gastrointestinal: Negative for nausea and vomiting.  Musculoskeletal: Positive for arthralgias and neck pain. Negative for myalgias and neck stiffness.  Skin: Negative for color change, pallor, rash and wound.  Neurological:  Negative for dizziness, weakness, numbness and headaches.     Physical Exam Updated Vital Signs BP (!) 123/109 (BP Location: Left Arm)   Pulse 79   Temp 98.6 F (37 C) (Oral)   Resp 16   SpO2 99%   Physical Exam  Constitutional: She appears well-developed and well-nourished. No distress.  Afebrile, nontoxic-appearing, sitting comfortable in chair in no acute distress.  HENT:  Head: Normocephalic and atraumatic.  Eyes: Conjunctivae and EOM are normal.  Neck: Normal range of motion. Neck supple.  No midline tenderness palpation of the C-spine  Cardiovascular: Normal rate,  regular rhythm and normal heart sounds.  No murmur heard. Pulmonary/Chest: Effort normal and breath sounds normal. No respiratory distress.  Musculoskeletal: Normal range of motion. She exhibits tenderness. She exhibits no edema or deformity.  Patient has full range of motion of the right shoulder. Negative Hawkins test. No bony tenderness palpation, no erythema, edema concerning for septic arthritis. Patient has tenderness over the right upper trapezius muscle.  Neurological: She is alert. No sensory deficit. She exhibits normal muscle tone.  5/5 strength in upper extremities bilaterally, shoulder flexion/extension, abduction/adduction, elbow flexion and extension and grips. Strong radial pulses. Neurovascularly intact.  Skin: Skin is warm and dry. Capillary refill takes less than 2 seconds. No rash noted. She is not diaphoretic. No erythema. No pallor.  Psychiatric: She has a normal mood and affect.  Nursing note and vitals reviewed.    ED Treatments / Results  Labs (all labs ordered are listed, but only abnormal results are displayed) Labs Reviewed - No data to display  EKG  EKG Interpretation None       Radiology No results found.  Procedures Procedures (including critical care time)  Medications Ordered in ED Medications  lidocaine (LIDODERM) 5 % 1 patch (1 patch Transdermal Patch Applied 02/07/17 1430)  oxyCODONE-acetaminophen (PERCOCET/ROXICET) 5-325 MG per tablet 1 tablet (1 tablet Oral Given 02/07/17 1430)     Initial Impression / Assessment and Plan / ED Course  I have reviewed the triage vital signs and the nursing notes.  Pertinent labs & imaging results that were available during my care of the patient were reviewed by me and considered in my medical decision making (see chart for details).    Patient presenting with chronic right shoulder pain. She was seen 4 days ago for same and prescribed meloxicam, Flexeril and given follow-up with Dr. Shon BatonBrooks  orthopedics.  Patient has full range of motion and no bony tenderness or edema, erythema or concern for septic arthritis. Neurovascularly intact. Patient symptoms consistent with muscle spasm and arthritis.  Patient reports no improvement from medications and wants something different. She has an appointment scheduled with orthopedics for next week.  Patient's pain was treated on the emergency department. Will discharge home with topical therapy and urge her to follow up at appointment. She was given shoulder range of motion exercises instructions.  Discharge home with symptomatic relief and close follow-up with ortho and PCP as needed.  Return precautions discussed.  Final Clinical Impressions(s) / ED Diagnoses   Final diagnoses:  Chronic right shoulder pain    ED Discharge Orders        Ordered    diclofenac sodium (VOLTAREN) 1 % GEL  4 times daily     02/07/17 1421       Gregary CromerMitchell, Xxavier Noon B, PA-C 02/07/17 1450    Jacalyn LefevreHaviland, Julie, MD 02/07/17 [phone]

## 2017-02-07 NOTE — ED Notes (Signed)
Pain medication given prior to discharge. Patient advised about side effects of medications and  to avoid driving for a minimum of 4 hours.  

## 2018-02-08 ENCOUNTER — Encounter (HOSPITAL_COMMUNITY): Payer: Self-pay

## 2018-02-08 ENCOUNTER — Other Ambulatory Visit: Payer: Self-pay

## 2018-02-08 ENCOUNTER — Emergency Department (HOSPITAL_COMMUNITY)
Admission: EM | Admit: 2018-02-08 | Discharge: 2018-02-08 | Disposition: A | Discharge status: Home / Self Care | Payer: Medicaid Other | Attending: Emergency Medicine | Admitting: Emergency Medicine

## 2018-02-08 DIAGNOSIS — R42 Dizziness and giddiness: Secondary | ICD-10-CM | POA: Diagnosis present

## 2018-02-08 DIAGNOSIS — Z7982 Long term (current) use of aspirin: Secondary | ICD-10-CM | POA: Insufficient documentation

## 2018-02-08 DIAGNOSIS — Z79899 Other long term (current) drug therapy: Secondary | ICD-10-CM | POA: Diagnosis not present

## 2018-02-08 DIAGNOSIS — F17200 Nicotine dependence, unspecified, uncomplicated: Secondary | ICD-10-CM | POA: Insufficient documentation

## 2018-02-08 DIAGNOSIS — E119 Type 2 diabetes mellitus without complications: Secondary | ICD-10-CM | POA: Insufficient documentation

## 2018-02-08 DIAGNOSIS — I10 Essential (primary) hypertension: Secondary | ICD-10-CM | POA: Insufficient documentation

## 2018-02-08 MED ORDER — MECLIZINE HCL 25 MG PO TABS: 25.0000 mg | ORAL_TABLET | Freq: Three times a day (TID) | ORAL | 0 refills | Status: AC | PRN

## 2018-02-08 NOTE — ED Notes (Signed)
PT refuse EKG state "doctor just giving me med so I called my ride"

## 2018-02-08 NOTE — ED Triage Notes (Signed)
Patient c/o dizziness and states she has a history of vertigo.

## 2018-02-10 NOTE — ED Provider Notes (Signed)
[facility]-EMERG[facility] DEPT Provider Note   CSN: [id number] Arrival date & time: 02/08/18  1136     History   Chief Complaint Chief Complaint  Patient presents with  . Dizziness    HPI [name] is a 62 y.o. female.  HPI   62yF with dizziness. Described sensation of room spinning. Onset this morning when getting out of bed. She has a past history of vertigo "and I need you to tell me this is all it is." Feel fine when still. Feels symptoms again when tilting head forward or sitting up. Denies any pain. No change in vision. No acute numbness, tingling or loss of strength. No ear ringing. Hasnt tired anything for symptoms.   Past Medical History:  Diagnosis Date  . Diabetes mellitus without complication (HCC)   . Hypertension   . Schizo-affective schizophrenia [facility])     Patient Active Problem List   Diagnosis Date Noted  . Schizo-affective schizophrenia [facility])     Past Surgical History:  Procedure Laterality Date  . APPENDECTOMY    . HERNIA REPAIR    . OOPHORECTOMY  unilateral  . ORIF Rt distal radius Right 05/15/14     OB History   None      Home Medications    Prior to Admission medications   Medication Sig Start Date End Date Taking? Authorizing Provider  ARIPiprazole (ABILIFY) 30 MG tablet Take 30 mg by mouth daily.   Yes [provider]  aspirin EC 81 MG tablet Take 81 mg by mouth daily.   Yes [provider]  cyclobenzaprine (FLEXERIL) 10 MG tablet Take 1 tablet (10 mg total) at bedtime as needed by mouth for muscle spasms. 02/02/17  Yes Plunkett, Alphonzo Lemmings, MD  hydrochlorothiazide (HYDRODIURIL) 25 MG tablet Take 25 mg by mouth daily.   Yes [provider]  naproxen sodium (ALEVE) 220 MG tablet Take 220 mg by mouth 2 (two) times daily as needed (arthritis).   Yes [provider]  diclofenac sodium (VOLTAREN) 1 % GEL Apply 2 g 4 (four) times daily topically. Patient not taking: Reported on 02/08/2018  02/07/17   Georgiana Shore, PA-C  meclizine (ANTIVERT) 25 MG tablet Take 1 tablet (25 mg total) by mouth 3 (three) times daily as needed for dizziness. 02/08/18   Raeford Razor, MD  meclizine (ANTIVERT) 50 MG tablet Take 0.5 tablets (25 mg total) by mouth 3 (three) times daily as needed. Patient not taking: Reported on 02/08/2018 11/10/14   Hedges, Tinnie Gens, PA-C  meloxicam (MOBIC) 7.5 MG tablet Take 1 tablet (7.5 mg total) daily by mouth. Patient not taking: Reported on 02/08/2018 02/02/17   Gwyneth Sprout, MD    Family History Family History  Problem Relation Age of Onset  . Hypertension Mother   . Diabetes Mother   . Heart failure Mother     Social History Social History   Tobacco Use  . Smoking status: Current Every Day Smoker    Types: Cigarettes  . Smokeless tobacco: Never Used  Substance Use Topics  . Alcohol use: No  . Drug use: No     Allergies   Tape   Review of Systems Review of Systems All systems reviewed and negative, other than as noted in HPI.   Physical Exam Updated Vital Signs BP (!) 120/94 (BP Location: Left Arm)   Pulse 76   Temp 98.5 F (36.9 C) (Oral)   Resp 18   Ht 5\' 4"  (1.626 m)   Wt 117.9 kg  SpO2 98%   BMI 44.63 kg/m   Physical Exam  Constitutional: She is oriented to person, place, and time. She appears well-developed and well-nourished. No distress.  HENT:  Head: Normocephalic and atraumatic.  Eyes: Conjunctivae are normal. Right eye exhibits no discharge. Left eye exhibits no discharge.  Neck: Neck supple.  Cardiovascular: Normal rate, regular rhythm and normal heart sounds. Exam reveals no gallop and no friction rub.  No murmur heard. Pulmonary/Chest: Effort normal and breath sounds normal. No respiratory distress.  Abdominal: Soft. She exhibits no distension. There is no tenderness.  Musculoskeletal: She exhibits no edema or tenderness.  Neurological: She is alert and oriented to person, place, and time. She displays  normal reflexes. No cranial nerve deficit. She exhibits normal muscle tone.  Good finger to nose. Steady gait.   Skin: Skin is warm and dry.  Psychiatric: She has a normal mood and affect. Her behavior is normal. Thought content normal.  Nursing note and vitals reviewed.    ED Treatments / Results  Labs (all labs ordered are listed, but only abnormal results are displayed) Labs Reviewed - No data to display  EKG None  Radiology No results found.  Procedures Procedures (including critical care time)  Medications Ordered in ED Medications - No data to display   Initial Impression / Assessment and Plan / ED Course  I have reviewed the triage vital signs and the nursing notes.  Pertinent labs & imaging results that were available during my care of the patient were reviewed by me and considered in my medical decision making (see chart for details).     62yF with vertigo. Consistent with peripheral cause. PRN meclizine. Epley maneuvers. Return precautions discussed.   Final Clinical Impressions(s) / ED Diagnoses   Final diagnoses:  Vertigo    ED Discharge Orders         Ordered    meclizine (ANTIVERT) 25 MG tablet  3 times daily PRN     02/08/18 1329           Raeford RazorKohut, Neiko Trivedi, MD 02/10/18 1528

## 2019-03-26 ENCOUNTER — Ambulatory Visit: Payer: Medicaid Other | Attending: Internal Medicine

## 2019-03-26 DIAGNOSIS — Z20822 Contact with and (suspected) exposure to covid-19: Secondary | ICD-10-CM

## 2019-03-27 LAB — NOVEL CORONAVIRUS, NAA: SARS-CoV-2, NAA: NOT DETECTED

## 2022-05-25 ENCOUNTER — Emergency Department (HOSPITAL_COMMUNITY): Payer: Medicaid Other

## 2022-05-25 ENCOUNTER — Other Ambulatory Visit: Payer: Self-pay

## 2022-05-25 ENCOUNTER — Emergency Department (HOSPITAL_COMMUNITY)
Admission: EM | Admit: 2022-05-25 | Discharge: 2022-05-25 | Disposition: A | Discharge status: Home / Self Care | Payer: Medicaid Other | Attending: Emergency Medicine | Admitting: Emergency Medicine

## 2022-05-25 DIAGNOSIS — R600 Localized edema: Secondary | ICD-10-CM | POA: Diagnosis not present

## 2022-05-25 DIAGNOSIS — I10 Essential (primary) hypertension: Secondary | ICD-10-CM | POA: Insufficient documentation

## 2022-05-25 DIAGNOSIS — R55 Syncope and collapse: Secondary | ICD-10-CM | POA: Diagnosis present

## 2022-05-25 DIAGNOSIS — E119 Type 2 diabetes mellitus without complications: Secondary | ICD-10-CM | POA: Insufficient documentation

## 2022-05-25 DIAGNOSIS — J101 Influenza due to other identified influenza virus with other respiratory manifestations: Secondary | ICD-10-CM | POA: Insufficient documentation

## 2022-05-25 DIAGNOSIS — Z79899 Other long term (current) drug therapy: Secondary | ICD-10-CM | POA: Diagnosis not present

## 2022-05-25 DIAGNOSIS — Z20822 Contact with and (suspected) exposure to covid-19: Secondary | ICD-10-CM | POA: Insufficient documentation

## 2022-05-25 DIAGNOSIS — Z7982 Long term (current) use of aspirin: Secondary | ICD-10-CM | POA: Diagnosis not present

## 2022-05-25 LAB — CBC WITH DIFFERENTIAL/PLATELET
Abs Immature Granulocytes: 0.02 10*3/uL (ref 0.00–0.07)
Basophils Absolute: 0 10*3/uL (ref 0.0–0.1)
Basophils Relative: 0 %
Eosinophils Absolute: 0 10*3/uL (ref 0.0–0.5)
Eosinophils Relative: 0 %
HCT: 41.4 % (ref 36.0–46.0)
Hemoglobin: 13.2 g/dL (ref 12.0–15.0)
Immature Granulocytes: 0 %
Lymphocytes Relative: 24 %
Lymphs Abs: 1.3 10*3/uL (ref 0.7–4.0)
MCH: 28 pg (ref 26.0–34.0)
MCHC: 31.9 g/dL (ref 30.0–36.0)
MCV: 87.7 fL (ref 80.0–100.0)
Monocytes Absolute: 0.5 10*3/uL (ref 0.1–1.0)
Monocytes Relative: 10 %
Neutro Abs: 3.6 10*3/uL (ref 1.7–7.7)
Neutrophils Relative %: 66 %
Platelets: 267 10*3/uL (ref 150–400)
RBC: 4.72 MIL/uL (ref 3.87–5.11)
RDW: 13.8 % (ref 11.5–15.5)
WBC: 5.4 10*3/uL (ref 4.0–10.5)
nRBC: 0 % (ref 0.0–0.2)

## 2022-05-25 LAB — BASIC METABOLIC PANEL
Anion gap: 10 (ref 5–15)
BUN: 9 mg/dL (ref 8–23)
CO2: 28 mmol/L (ref 22–32)
Calcium: 8.3 mg/dL — ABNORMAL LOW (ref 8.9–10.3)
Chloride: 96 mmol/L — ABNORMAL LOW (ref 98–111)
Creatinine, Ser: 1.12 mg/dL — ABNORMAL HIGH (ref 0.44–1.00)
GFR, Estimated: 55 mL/min — ABNORMAL LOW (ref >60)
Glucose, Bld: 145 mg/dL — ABNORMAL HIGH (ref 70–99)
Potassium: 3.1 mmol/L — ABNORMAL LOW (ref 3.5–5.1)
Sodium: 134 mmol/L — ABNORMAL LOW (ref 135–145)

## 2022-05-25 LAB — TROPONIN I (HIGH SENSITIVITY)
Troponin I (High Sensitivity): 23 ng/L — ABNORMAL HIGH (ref <18)
Troponin I (High Sensitivity): 20 ng/L — ABNORMAL HIGH (ref <18)

## 2022-05-25 LAB — BRAIN NATRIURETIC PEPTIDE: B Natriuretic Peptide: 7.9 pg/mL (ref 0.0–100.0)

## 2022-05-25 LAB — RESP PANEL BY RT-PCR (RSV, FLU A&B, COVID)  RVPGX2
Influenza A by PCR: POSITIVE — AB
Influenza B by PCR: NEGATIVE
Resp Syncytial Virus by PCR: NEGATIVE
SARS Coronavirus 2 by RT PCR: NEGATIVE

## 2022-05-25 MED ORDER — POTASSIUM CHLORIDE CRYS ER 20 MEQ PO TBCR
40.0000 meq | EXTENDED_RELEASE_TABLET | Freq: Once | ORAL | Status: AC
  Administered 2022-05-25: 40 meq via ORAL
  Filled 2022-05-25: qty 2

## 2022-05-25 MED ORDER — DOXYCYCLINE HYCLATE 100 MG PO CAPS: 100.0000 mg | ORAL_CAPSULE | Freq: Two times a day (BID) | ORAL | 0 refills | Status: DC

## 2022-05-25 MED ORDER — SODIUM CHLORIDE 0.9 % IV BOLUS
1000.0000 mL | Freq: Once | INTRAVENOUS | Status: AC
  Administered 2022-05-25: 1000 mL via INTRAVENOUS

## 2022-05-25 MED ORDER — DOXYCYCLINE HYCLATE 100 MG PO CAPS: 100.0000 mg | ORAL_CAPSULE | Freq: Two times a day (BID) | ORAL | 0 refills | Status: AC

## 2022-05-25 MED ORDER — ACETAMINOPHEN 325 MG PO TABS
650.0000 mg | ORAL_TABLET | Freq: Once | ORAL | Status: AC
  Administered 2022-05-25: 650 mg via ORAL
  Filled 2022-05-25: qty 2

## 2022-05-25 NOTE — Discharge Instructions (Addendum)
You have tested positive for the flu this is why you are feeling poorly, symptoms usually last about 7 days.  Please make sure you are drinking plenty of fluids and staying well-hydrated I suspect this is why you felt lightheaded and passed out today.  You can take Tylenol 650 mg every 6 hours to help with pain and fevers.  Your chest x-ray showed a small area of possible early pneumonia, please take prescribed antibiotics.  Follow-up closely with your primary care doctor and return to the emergency department for any new or worsening symptoms.
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
## 2022-05-25 NOTE — ED Notes (Signed)
Patient states she is dizzy and lightheaded while standing.

## 2022-05-25 NOTE — ED Triage Notes (Addendum)
Pt BIB EMS from home after a fall. Pt c/o body aches, weakness, fatigue. Daughter was dx with Flu, pt has had symptoms for over a week.

## 2022-07-08 ENCOUNTER — Other Ambulatory Visit: Payer: Self-pay

## 2022-07-08 ENCOUNTER — Encounter (HOSPITAL_COMMUNITY): Payer: Self-pay

## 2022-07-08 ENCOUNTER — Emergency Department (HOSPITAL_COMMUNITY)
Admission: EM | Admit: 2022-07-08 | Discharge: 2022-07-08 | Disposition: A | Discharge status: Home / Self Care | Payer: Medicaid Other | Attending: Emergency Medicine | Admitting: Emergency Medicine

## 2022-07-08 DIAGNOSIS — M542 Cervicalgia: Secondary | ICD-10-CM | POA: Insufficient documentation

## 2022-07-08 DIAGNOSIS — Z7982 Long term (current) use of aspirin: Secondary | ICD-10-CM | POA: Insufficient documentation

## 2022-07-08 DIAGNOSIS — M25522 Pain in left elbow: Secondary | ICD-10-CM | POA: Insufficient documentation

## 2022-07-08 DIAGNOSIS — E119 Type 2 diabetes mellitus without complications: Secondary | ICD-10-CM | POA: Insufficient documentation

## 2022-07-08 DIAGNOSIS — Z79899 Other long term (current) drug therapy: Secondary | ICD-10-CM | POA: Insufficient documentation

## 2022-07-08 DIAGNOSIS — Y92410 Unspecified street and highway as the place of occurrence of the external cause: Secondary | ICD-10-CM | POA: Diagnosis not present

## 2022-07-08 DIAGNOSIS — I10 Essential (primary) hypertension: Secondary | ICD-10-CM | POA: Insufficient documentation

## 2022-07-08 MED ORDER — CYCLOBENZAPRINE HCL 10 MG PO TABS: 10.0000 mg | ORAL_TABLET | Freq: Three times a day (TID) | ORAL | 0 refills | Status: AC | PRN

## 2022-07-08 MED ORDER — IBUPROFEN 800 MG PO TABS
800.0000 mg | ORAL_TABLET | Freq: Once | ORAL | Status: AC
  Administered 2022-07-08: 800 mg via ORAL
  Filled 2022-07-08: qty 1

## 2022-07-08 MED ORDER — DICLOFENAC SODIUM 1 % TD GEL: 2.0000 g | Freq: Four times a day (QID) | TRANSDERMAL | 0 refills | Status: DC

## 2022-07-08 MED ORDER — DICLOFENAC SODIUM 1 % TD GEL: 2.0000 g | Freq: Four times a day (QID) | TRANSDERMAL | 0 refills | Status: AC

## 2022-07-08 MED ORDER — CYCLOBENZAPRINE HCL 10 MG PO TABS: 10.0000 mg | ORAL_TABLET | Freq: Three times a day (TID) | ORAL | 0 refills | Status: DC | PRN
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
## 2022-09-15 ENCOUNTER — Emergency Department (HOSPITAL_COMMUNITY)
Admission: EM | Admit: 2022-09-15 | Discharge: 2022-09-15 | Disposition: A | Discharge status: Home / Self Care | Payer: Medicaid Other | Attending: Emergency Medicine | Admitting: Emergency Medicine

## 2022-09-15 DIAGNOSIS — R519 Headache, unspecified: Secondary | ICD-10-CM | POA: Diagnosis not present

## 2022-09-15 DIAGNOSIS — Z7982 Long term (current) use of aspirin: Secondary | ICD-10-CM | POA: Insufficient documentation

## 2022-09-15 DIAGNOSIS — H9202 Otalgia, left ear: Secondary | ICD-10-CM | POA: Diagnosis not present

## 2022-09-15 MED ORDER — CIPROFLOXACIN-DEXAMETHASONE 0.3-0.1 % OT SUSP: 4.0000 [drp] | Freq: Two times a day (BID) | OTIC | 0 refills | Status: AC

## 2022-09-15 NOTE — Discharge Instructions (Addendum)
Apply drops as prescribed. Recheck with your PCP if not improving.

## 2022-09-15 NOTE — ED Triage Notes (Signed)
Patient here from home reporting left ear pain that started today. Very painful radiating down into neck.

## 2022-09-15 NOTE — ED Provider Notes (Signed)
[place] EMERGENCY DEPARTMENT AT [facility] Provider Note   CSN: [id number] Arrival date & time: 09/15/22  2038     History  Chief Complaint  Patient presents with   Ear Pain    [name] is a 67 y.o. female.  67 year old female presents with left ear pain, sudden onset today after bringing her laundry in from outside. Felt like she had a bug in her ear, scratched at her hear, now with left side face pain and feels like face is swollen. No other complaints or concerns.        Home Medications Prior to Admission medications   Medication Sig Start Date End Date Taking? Authorizing Provider  ciprofloxacin-dexamethasone (CIPRODEX) OTIC suspension Place 4 drops into the left ear 2 (two) times daily. 09/15/22  Yes Jeannie Fend, PA-C  ARIPiprazole (ABILIFY) 30 MG tablet Take 30 mg by mouth daily.    [provider]  aspirin EC 81 MG tablet Take 81 mg by mouth daily.    [provider]  cyclobenzaprine (FLEXERIL) 10 MG tablet Take 1 tablet (10 mg total) by mouth 3 (three) times daily as needed for muscle spasms. 07/08/22   Fayrene Helper, PA-C  diclofenac sodium (VOLTAREN) 1 % GEL Apply 2 g topically 4 (four) times daily. 07/08/22   Fayrene Helper, PA-C  doxycycline (VIBRAMYCIN) 100 MG capsule Take 1 capsule (100 mg total) by mouth 2 (two) times daily. One po bid x 7 days 05/25/22   Dartha Lodge, PA-C  hydrochlorothiazide (HYDRODIURIL) 25 MG tablet Take 25 mg by mouth daily.    [provider]  meclizine (ANTIVERT) 25 MG tablet Take 1 tablet (25 mg total) by mouth 3 (three) times daily as needed for dizziness. 02/08/18   Raeford Razor, MD  meclizine (ANTIVERT) 50 MG tablet Take 0.5 tablets (25 mg total) by mouth 3 (three) times daily as needed. Patient not taking: Reported on 02/08/2018 11/10/14   Hedges, Tinnie Gens, PA-C  meloxicam (MOBIC) 7.5 MG tablet Take 1 tablet (7.5 mg total) daily by mouth. Patient not taking: Reported on 02/08/2018 02/02/17    Gwyneth Sprout, MD  naproxen sodium (ALEVE) 220 MG tablet Take 220 mg by mouth 2 (two) times daily as needed (arthritis).    [provider]      Allergies    Tape    Review of Systems   Review of Systems Negative except as per HPI Physical Exam Updated Vital Signs BP (!) 154/106 (BP Location: Left Arm)   Pulse 85   Temp 99.6 F (37.6 C) (Oral)   Resp 18   SpO2 98%  Physical Exam Vitals and nursing note reviewed.  Constitutional:      General: She is not in acute distress.    Appearance: She is well-developed. She is not diaphoretic.  HENT:     Head: Normocephalic and atraumatic.     Comments: No appreciable facial swelling    Right Ear: Tympanic membrane and ear canal normal.     Left Ear: Tympanic membrane is injected. Tympanic membrane is not erythematous.     Ears:     Comments: TM and canal injected  Pulmonary:     Effort: Pulmonary effort is normal.  Skin:    General: Skin is warm and dry.     Findings: No erythema or rash.  Neurological:     Mental Status: She is alert and oriented to person, place, and time.  Psychiatric:  Behavior: Behavior normal.     ED Results / Procedures / Treatments   Labs (all labs ordered are listed, but only abnormal results are displayed) Labs Reviewed - No data to display  EKG None  Radiology No results found.  Procedures Procedures    Medications Ordered in ED Medications - No data to display  ED Course/ Medical Decision Making/ A&P                             Medical Decision Making Risk Prescription drug management.   67 year old female with left ear pain as above. Found to have left TM and canal with injection, no FB/insect. Suspect patient has irritation from scratching her ear. Can apply drops to ear and recheck with PCP if not improving.         Final Clinical Impression(s) / ED Diagnoses Final diagnoses:  Acute ear pain, left    Rx / DC Orders ED Discharge Orders           Ordered    ciprofloxacin-dexamethasone (CIPRODEX) OTIC suspension  2 times daily        09/15/22 2231              Jeannie Fend, PA-C 09/15/22 2255    Jacalyn Lefevre, MD 09/18/22 [phone]
